# Patient Record
Sex: Male | Born: 1940 | Race: White | Hispanic: No | Marital: Married | Smoking: Former smoker
Health system: Southern US, Community
[De-identification: ages and names within clinical notes are randomized; demographics above are authoritative.]

## PROBLEM LIST (undated history)

## (undated) DIAGNOSIS — H409 Unspecified glaucoma: Secondary | ICD-10-CM

## (undated) DIAGNOSIS — I10 Essential (primary) hypertension: Secondary | ICD-10-CM

## (undated) DIAGNOSIS — J45909 Unspecified asthma, uncomplicated: Secondary | ICD-10-CM

## (undated) DIAGNOSIS — Z87442 Personal history of urinary calculi: Secondary | ICD-10-CM

## (undated) DIAGNOSIS — R0600 Dyspnea, unspecified: Secondary | ICD-10-CM

## (undated) HISTORY — PX: COLECTOMY: SHX59

## (undated) HISTORY — PX: APPENDECTOMY: SHX54

## (undated) HISTORY — PX: HERNIA REPAIR: SHX51

---

## 2001-10-22 ENCOUNTER — Ambulatory Visit (HOSPITAL_COMMUNITY): Admission: RE | Admit: 2001-10-22 | Discharge: 2001-10-22 | Payer: Self-pay | Admitting: Family Medicine

## 2001-10-22 ENCOUNTER — Encounter: Payer: Self-pay | Admitting: Family Medicine

## 2008-12-27 ENCOUNTER — Ambulatory Visit (HOSPITAL_COMMUNITY): Admission: RE | Admit: 2008-12-27 | Discharge: 2008-12-27 | Payer: Self-pay | Admitting: General Surgery

## 2010-06-20 LAB — BASIC METABOLIC PANEL
BUN: 11 mg/dL (ref 6–23)
CO2: 27 mEq/L (ref 19–32)
Calcium: 8.9 mg/dL (ref 8.4–10.5)
Chloride: 103 mEq/L (ref 96–112)
Creatinine, Ser: 0.66 mg/dL (ref 0.4–1.5)
GFR calc Af Amer: >60 mL/min (ref >60)
GFR calc non Af Amer: >60 mL/min (ref >60)
Glucose, Bld: 136 mg/dL — ABNORMAL HIGH (ref 70–99)
Potassium: 4.1 mEq/L (ref 3.5–5.1)
Sodium: 136 mEq/L (ref 135–145)

## 2010-06-20 LAB — CBC
HCT: 43.8 % (ref 39.0–52.0)
Hemoglobin: 14.9 g/dL (ref 13.0–17.0)
MCHC: 34.1 g/dL (ref 30.0–36.0)
MCV: 92.2 fL (ref 78.0–100.0)
Platelets: 235 10*3/uL (ref 150–400)
RBC: 4.75 MIL/uL (ref 4.22–5.81)
RDW: 13.5 % (ref 11.5–15.5)
WBC: 6.6 10*3/uL (ref 4.0–10.5)

## 2011-09-10 DIAGNOSIS — H4011X0 Primary open-angle glaucoma, stage unspecified: Secondary | ICD-10-CM | POA: Diagnosis not present

## 2011-09-10 DIAGNOSIS — H409 Unspecified glaucoma: Secondary | ICD-10-CM | POA: Diagnosis not present

## 2011-09-10 DIAGNOSIS — H5200 Hypermetropia, unspecified eye: Secondary | ICD-10-CM | POA: Diagnosis not present

## 2011-09-10 DIAGNOSIS — H2589 Other age-related cataract: Secondary | ICD-10-CM | POA: Diagnosis not present

## 2011-10-02 DIAGNOSIS — H4011X0 Primary open-angle glaucoma, stage unspecified: Secondary | ICD-10-CM | POA: Diagnosis not present

## 2011-10-02 DIAGNOSIS — H409 Unspecified glaucoma: Secondary | ICD-10-CM | POA: Diagnosis not present

## 2011-10-24 DIAGNOSIS — H409 Unspecified glaucoma: Secondary | ICD-10-CM | POA: Diagnosis not present

## 2011-10-24 DIAGNOSIS — H4011X0 Primary open-angle glaucoma, stage unspecified: Secondary | ICD-10-CM | POA: Diagnosis not present

## 2011-11-10 DIAGNOSIS — H25099 Other age-related incipient cataract, unspecified eye: Secondary | ICD-10-CM | POA: Diagnosis not present

## 2011-11-10 DIAGNOSIS — H4011X0 Primary open-angle glaucoma, stage unspecified: Secondary | ICD-10-CM | POA: Diagnosis not present

## 2011-11-10 DIAGNOSIS — H409 Unspecified glaucoma: Secondary | ICD-10-CM | POA: Diagnosis not present

## 2011-11-11 ENCOUNTER — Emergency Department (HOSPITAL_COMMUNITY): Payer: Medicare Other

## 2011-11-11 ENCOUNTER — Encounter (HOSPITAL_COMMUNITY): Payer: Self-pay | Admitting: Emergency Medicine

## 2011-11-11 ENCOUNTER — Other Ambulatory Visit: Payer: Self-pay

## 2011-11-11 ENCOUNTER — Emergency Department (HOSPITAL_COMMUNITY)
Admission: EM | Admit: 2011-11-11 | Discharge: 2011-11-11 | Disposition: A | Discharge status: Home / Self Care | Payer: Medicare Other | Attending: Emergency Medicine | Admitting: Emergency Medicine

## 2011-11-11 DIAGNOSIS — N200 Calculus of kidney: Secondary | ICD-10-CM | POA: Insufficient documentation

## 2011-11-11 DIAGNOSIS — R1030 Lower abdominal pain, unspecified: Secondary | ICD-10-CM

## 2011-11-11 DIAGNOSIS — R109 Unspecified abdominal pain: Secondary | ICD-10-CM | POA: Diagnosis not present

## 2011-11-11 DIAGNOSIS — M7989 Other specified soft tissue disorders: Secondary | ICD-10-CM | POA: Diagnosis not present

## 2011-11-11 DIAGNOSIS — I8310 Varicose veins of unspecified lower extremity with inflammation: Secondary | ICD-10-CM | POA: Insufficient documentation

## 2011-11-11 DIAGNOSIS — K4090 Unilateral inguinal hernia, without obstruction or gangrene, not specified as recurrent: Secondary | ICD-10-CM | POA: Diagnosis not present

## 2011-11-11 DIAGNOSIS — I872 Venous insufficiency (chronic) (peripheral): Secondary | ICD-10-CM

## 2011-11-11 HISTORY — DX: Unspecified asthma, uncomplicated: J45.909

## 2011-11-11 LAB — COMPREHENSIVE METABOLIC PANEL
ALT: 15 U/L (ref 0–53)
BUN: 11 mg/dL (ref 6–23)
CO2: 28 mEq/L (ref 19–32)
Calcium: 9.1 mg/dL (ref 8.4–10.5)
Creatinine, Ser: 0.72 mg/dL (ref 0.50–1.35)
GFR calc Af Amer: >90 mL/min (ref >90)
GFR calc non Af Amer: >90 mL/min (ref >90)
Glucose, Bld: 132 mg/dL — ABNORMAL HIGH (ref 70–99)
Sodium: 138 mEq/L (ref 135–145)

## 2011-11-11 LAB — CBC WITH DIFFERENTIAL/PLATELET
Eosinophils Absolute: 0.5 10*3/uL (ref 0.0–0.7)
Eosinophils Relative: 5 % (ref 0–5)
HCT: 43.8 % (ref 39.0–52.0)
Hemoglobin: 14.7 g/dL (ref 13.0–17.0)
Lymphocytes Relative: 15 % (ref 12–46)
Lymphs Abs: 1.4 10*3/uL (ref 0.7–4.0)
MCH: 30.9 pg (ref 26.0–34.0)
MCV: 92.2 fL (ref 78.0–100.0)
Monocytes Absolute: 0.7 10*3/uL (ref 0.1–1.0)
Monocytes Relative: 7 % (ref 3–12)
RBC: 4.75 MIL/uL (ref 4.22–5.81)
WBC: 9.7 10*3/uL (ref 4.0–10.5)

## 2011-11-11 LAB — URINALYSIS, ROUTINE W REFLEX MICROSCOPIC
Bilirubin Urine: NEGATIVE
Glucose, UA: NEGATIVE mg/dL
Hgb urine dipstick: NEGATIVE
Ketones, ur: NEGATIVE mg/dL
Leukocytes, UA: NEGATIVE
Nitrite: NEGATIVE
Protein, ur: NEGATIVE mg/dL
Specific Gravity, Urine: >1.03 — ABNORMAL HIGH (ref 1.005–1.030)
Urobilinogen, UA: 0.2 mg/dL (ref 0.0–1.0)

## 2011-11-11 LAB — PROTIME-INR: Prothrombin Time: 13.6 seconds (ref 11.6–15.2)

## 2011-11-11 LAB — TROPONIN I: Troponin I: <0.3 ng/mL (ref <0.30)

## 2011-11-11 MED ORDER — CEPHALEXIN 500 MG PO CAPS: 500.0000 mg | ORAL_CAPSULE | Freq: Three times a day (TID) | ORAL | Status: AC

## 2011-11-11 MED ORDER — MORPHINE SULFATE 4 MG/ML IJ SOLN
4.0000 mg | Freq: Once | INTRAMUSCULAR | Status: AC
  Administered 2011-11-11: 4 mg via INTRAVENOUS
  Filled 2011-11-11: qty 1

## 2011-11-11 MED ORDER — SODIUM CHLORIDE 0.9 % IV SOLN
INTRAVENOUS | Status: DC
  Administered 2011-11-11: 12:00:00 via INTRAVENOUS

## 2011-11-11 MED ORDER — TRAMADOL-ACETAMINOPHEN 37.5-325 MG PO TABS: ORAL_TABLET | ORAL | Status: AC

## 2011-11-11 MED ORDER — ONDANSETRON HCL 4 MG/2ML IJ SOLN
4.0000 mg | Freq: Once | INTRAMUSCULAR | Status: AC
  Administered 2011-11-11: 4 mg via INTRAVENOUS
  Filled 2011-11-11: qty 2

## 2011-11-11 NOTE — ED Provider Notes (Cosign Needed)
History  This chart was scribed for Ward Givens, MD by Ladona Ridgel Day. This patient was seen in room APA02/APA02 and the patient's care was started at 1012.   CSN: [id number]  Arrival date & time 11/11/11  1012   First MD Initiated Contact with Patient 11/11/11 1053      Chief Complaint  Patient presents with  . Abdominal Pain    (Consider location/radiation/quality/duration/timing/severity/associated sxs/prior treatment) The history is provided by the patient. No language interpreter was used.   Mark Harris is a 71 y.o. male who presents to the Emergency Department complaining of sudden onset of constant bilateral lower abdominal pain with diaphoresis this AM about one hour ago. He states he was at rest when his symptoms started and not sure what caused his abdominal pain, he also felt nauseated. He states he is not currently diaphoretic and his abdominal pain is pressure like and below his umbilicus on both sides of his abdomen. He states his symptoms feels somewhat similar to a previous kidney stone that he had. He denies any previous abdominal aneurysms or distention of his abdomen. He denies any back pain, flank pain, abdominal bloating,diarrhea, emesis, urinary symptoms, fever or any modifying factors to his pain. PT states his pain is a "2" and was a "7" at it's worst. He also complains of a red rash on his right lower leg which has been there for about one month with some swelling of his right lower leg.    He is followed by his PCP Dr. Phillips Odor  Past Medical History  Diagnosis Date  . Kidney stones 20 years ago   . Asthma     Past Surgical History  Procedure Date  . Hernia repair     History reviewed. No pertinent family history.  History  Substance Use Topics  . Smoking status: Former Games developer  . Smokeless tobacco: Not on file  . Alcohol Use: Yes     one glass of wine a day  Lives at home Lives with spouse   Review of Systems  Constitutional: Positive for  diaphoresis. Negative for fever and chills.  HENT: Negative for congestion.   Respiratory: Negative for cough and shortness of breath.   Gastrointestinal: Positive for abdominal pain (Lower abdominal pain. ). Negative for nausea and vomiting.  Genitourinary: Negative for dysuria and difficulty urinating.  Musculoskeletal: Negative for back pain.  Skin: Positive for rash (Rash over his right lower leg.  ).  Neurological: Negative for weakness.  All other systems reviewed and are negative.    Allergies  Peanuts  Home Medications   Current Outpatient Rx  Name Route Sig Dispense Refill  . ALBUTEROL SULFATE HFA 108 (90 BASE) MCG/ACT IN AERS Inhalation Inhale 2 puffs into the lungs every 6 (six) hours as needed. asthma    . BIMATOPROST 0.01 % OP SOLN Both Eyes Place 1 drop into both eyes at bedtime.    Marland Kitchen BRIMONIDINE TARTRATE-TIMOLOL 0.2-0.5 % OP SOLN Right Eye Place 1 drop into the right eye every 12 (twelve) hours. Prescription has not been picked up pending insurance    . FLUTICASONE-SALMETEROL 250-50 MCG/DOSE IN AEPB Inhalation Inhale 1 puff into the lungs 2 (two) times daily as needed. asthma      Triage Vitals: BP 148/54  Pulse 61  Temp 97.5 F (36.4 C) (Oral)  Resp 17  Ht 5\' 6"  (1.676 m)  Wt 178 lb (80.74 kg)  BMI 28.73 kg/m2  SpO2 95%  Vital signs normal  Physical Exam  Nursing note and vitals reviewed. Constitutional: He is oriented to person, place, and time. He appears well-developed and well-nourished.  Non-toxic appearance. He does not appear ill. No distress.  HENT:  Head: Normocephalic and atraumatic.  Right Ear: External ear normal.  Left Ear: External ear normal.  Nose: Nose normal. No mucosal edema or rhinorrhea.  Mouth/Throat: Oropharynx is clear and moist and mucous membranes are normal. No dental abscesses or uvula swelling.  Eyes: Conjunctivae and EOM are normal. Pupils are equal, round, and reactive to light.  Neck: Normal range of motion and full  passive range of motion without pain. Neck supple.  Cardiovascular: Normal rate, regular rhythm and normal heart sounds.  Exam reveals no gallop and no friction rub.   No murmur heard. Pulmonary/Chest: Effort normal and breath sounds normal. No respiratory distress. He has no wheezes. He has no rhonchi. He has no rales. He exhibits no tenderness and no crepitus.  Abdominal: Soft. Normal appearance and bowel sounds are normal. He exhibits no distension. There is no tenderness. There is no rebound and no guarding.  Musculoskeletal: Normal range of motion. He exhibits no edema and no tenderness.       Moves all extremities well.   Neurological: He is alert and oriented to person, place, and time. He has normal strength. No cranial nerve deficit.  Skin: Skin is warm, dry and intact. No rash noted. No erythema. No pallor.       Diffusely swollen right leg. Area of redness extending from right medial malleolus to mid lower leg with small appearing vesicles.   Psychiatric: He has a normal mood and affect. His speech is normal and behavior is normal. His mood appears not anxious.    ED Course  Procedures (including critical care time)    Medications  0.9 %  sodium chloride infusion (0  Intravenous Stopped 11/11/11 1454)  morphine 4 MG/ML injection 4 mg (4 mg Intravenous Given 11/11/11 1140)  ondansetron (ZOFRAN) injection 4 mg (4 mg Intravenous Given 11/11/11 1140)   Patient was pain-free at time of discharge.  After reviewing CT scan I examined his groin. He has no obvious inguinal hernia on exam. He strained and he did appear to have some fullness in the left inguinal region which was mildly tender to palpation. Pt states he has seen Dr Leticia Penna in the past.   DIAGNOSTIC STUDIES: Oxygen Saturation is 95% on room air, adequate by my interpretation.    COORDINATION OF CARE: At 1120 AM Discussed treatment plan with patient which includes abdominal CT, blood work, IV fluids, pain medicine, and  right lower leg [place]. Patient agrees.   Results for orders placed during the hospital encounter of 11/11/11  URINALYSIS, ROUTINE W REFLEX MICROSCOPIC      Component Value Range   Color, Urine YELLOW  YELLOW   APPearance CLEAR  CLEAR   Specific Gravity, Urine >1.030 (*) 1.005 - 1.030   pH 5.5  5.0 - 8.0   Glucose, UA NEGATIVE  NEGATIVE mg/dL   Hgb urine dipstick NEGATIVE  NEGATIVE   Bilirubin Urine NEGATIVE  NEGATIVE   Ketones, ur NEGATIVE  NEGATIVE mg/dL   Protein, ur NEGATIVE  NEGATIVE mg/dL   Urobilinogen, UA 0.2  0.0 - 1.0 mg/dL   Nitrite NEGATIVE  NEGATIVE   Leukocytes, UA NEGATIVE  NEGATIVE  CBC WITH DIFFERENTIAL      Component Value Range   WBC 9.7  4.0 - 10.5 K/uL   RBC 4.75  4.22 -  5.81 MIL/uL   Hemoglobin 14.7  13.0 - 17.0 g/dL   HCT 16.1  09.6 - 04.5 %   MCV 92.2  78.0 - 100.0 fL   MCH 30.9  26.0 - 34.0 pg   MCHC 33.6  30.0 - 36.0 g/dL   RDW 40.9  81.1 - 91.4 %   Platelets 236  150 - 400 K/uL   Neutrophils Relative 73  43 - 77 %   Neutro Abs 7.1  1.7 - 7.7 K/uL   Lymphocytes Relative 15  12 - 46 %   Lymphs Abs 1.4  0.7 - 4.0 K/uL   Monocytes Relative 7  3 - 12 %   Monocytes Absolute 0.7  0.1 - 1.0 K/uL   Eosinophils Relative 5  0 - 5 %   Eosinophils Absolute 0.5  0.0 - 0.7 K/uL   Basophils Relative 0  0 - 1 %   Basophils Absolute 0.0  0.0 - 0.1 K/uL  COMPREHENSIVE METABOLIC PANEL      Component Value Range   Sodium 138  135 - 145 mEq/L   Potassium 3.9  3.5 - 5.1 mEq/L   Chloride 102  96 - 112 mEq/L   CO2 28  19 - 32 mEq/L   Glucose, Bld 132 (*) 70 - 99 mg/dL   BUN 11  6 - 23 mg/dL   Creatinine, Ser 7.82  0.50 - 1.35 mg/dL   Calcium 9.1  8.4 - 95.6 mg/dL   Total Protein 7.3  6.0 - 8.3 g/dL   Albumin 3.6  3.5 - 5.2 g/dL   AST 17  0 - 37 U/L   ALT 15  0 - 53 U/L   Alkaline Phosphatase 80  39 - 117 U/L   Total Bilirubin 0.3  0.3 - 1.2 mg/dL   GFR calc non Af Amer >90  >90 mL/min   GFR calc Af Amer >90  >90 mL/min  TROPONIN I      Component Value Range    Troponin I <0.30  <0.30 ng/mL  APTT      Component Value Range   aPTT 30  24 - 37 seconds  PROTIME-INR      Component Value Range   Prothrombin Time 13.6  11.6 - 15.2 seconds   INR 1.02  0.00 - 1.49   Laboratory interpretation all normal except concentrated urine, mild hyperglycemia  Ct Abdomen Pelvis Wo Contrast  11/11/2011  *RADIOLOGY REPORT*  Clinical Data: Abdominal pain.  CT ABDOMEN AND PELVIS WITHOUT CONTRAST  Technique:  Multidetector CT imaging of the abdomen and pelvis was performed following the standard protocol without intravenous contrast.  Comparison: No priors.  Findings:  Lung Bases: Linear opacities in the medial segment of the right middle lobe and inferior segment of the lingula, likely represent areas of scarring (both areas appear to be associated with focal expansion of the extrapleural fat).  4 mm left lower lobe pulmonary nodule (image 6 of series three).  Small - moderate hiatal hernia.  Abdomen/Pelvis:  Image 23 of series 2 demonstrates a 4 mm nonobstructive calculus in the upper pole collecting system of the right kidney.  No additional calculi are noted along the course of either ureter, with the left renal collecting system or within the lumen of the urinary bladder.  No hydroureteronephrosis to suggest urinary tract obstruction at this time.  The unenhanced appearance of the liver, gallbladder, pancreas, spleen and bilateral adrenal glands is unremarkable.  No ascites or pneumoperitoneum and no pathologic distension of  bowel.  Normal appendix.  Numerous colonic diverticula, without surrounding inflammatory changes to suggest acute diverticulitis at this time. Prostate and urinary bladder are unremarkable.  There are large bilateral inguinal hernias, containing only fat (left greater than right).  On the left side, one of the colonic diverticula appears to extend toward the neck of the left inguinal hernia.  Musculoskeletal: There are no aggressive appearing lytic or blastic  lesions noted in the visualized portions of the skeleton.  IMPRESSION: 1.  No definite acute findings in the abdomen or pelvis to account for the patient's symptoms. 2.  However, there are large bilateral inguinal hernias (left greater than right) which appear to contain only fat. Notably, there is extension of a colonic diverticula from the proximal sigmoid colon toward the neck of the hernia on the left side, suggesting that there may be intermittent extension of bowel into this hernia.  No current signs of bowel incarceration or obstruction at this time. 3.  Normal appendix. 4.  4 mm nonobstructive calculus in the upper pole of the right kidney. 5.  Colonic diverticulosis without evidence to suggest acute diverticulitis at this time.   Original Report Authenticated By: Florencia Reasons, M.D.    US Venous Img Lower Unilateral Right  11/11/2011  *RADIOLOGY REPORT*  Clinical Data: Right lower extremity swelling, lower leg redness  RIGHT LOWER EXTREMITY VENOUS DUPLEX ULTRASOUND  Technique:  Gray-scale sonography with graded compression, as well as color Doppler and duplex ultrasound, were performed to evaluate the deep venous system of the lower extremity from the level of the common femoral vein through the popliteal and proximal calf veins. Spectral Doppler was utilized to evaluate flow at rest and with distal augmentation maneuvers.  Comparison:  None  Findings: Deep venous system patent and compressible from right groin through popliteal fossa. Spontaneous venous flow present with intact augmentation and evidence of respiratory phasicity. No intraluminal thrombus identified. Visualized portion of the greater saphenous system unremarkable.  IMPRESSION: No evidence of deep venous thrombosis in the right lower extremity.   Original Report Authenticated By: Lollie Marrow, M.D.     Date: 11/11/2011  Rate: 62  Rhythm: normal sinus rhythm  QRS Axis: normal  Intervals: normal  ST/T Wave abnormalities:  normal  Conduction Disutrbances:none  Narrative Interpretation:   Old EKG Reviewed: [facility] 12/25/2008    1. Lower abdominal pain   2. Inguinal hernia   3. Renal stone   4. Venous stasis dermatitis     New Prescriptions   CEPHALEXIN (KEFLEX) 500 MG CAPSULE    Take 1 capsule (500 mg total) by mouth 3 (three) times daily.   TRAMADOL-ACETAMINOPHEN (ULTRACET) 37.5-325 MG PER TABLET    2 tabs po QID prn pain    Plan discharge  Devoria Albe, MD, FACEP   MDM  I personally performed the services described in this documentation, which was scribed in my presence. The recorded information has been reviewed and considered.  Devoria Albe, MD, Armando Gang          Ward Givens, MD 11/11/11 [id number]

## 2011-11-11 NOTE — ED Notes (Signed)
Pt c/o sudden continuous cramping to lower abd around 35 min ago with sweating and wife states he was very pale. Pt felt "faint"Pt is alert/oriented. Pain present but not as bad now. Denies gu sx's. Last normal bm x 2 hrs. Has not noticed any black or bloody stools. Was nauseated earlier. Denies cp/sob. nondiaphoretic now and color wnl.

## 2011-11-11 NOTE — ED Notes (Signed)
Pt is aware of a urine sample, pt states he cannot void at this time, pt stated when he voids he will let staff know.

## 2011-11-18 DIAGNOSIS — K4090 Unilateral inguinal hernia, without obstruction or gangrene, not specified as recurrent: Secondary | ICD-10-CM | POA: Diagnosis not present

## 2011-11-18 DIAGNOSIS — R7301 Impaired fasting glucose: Secondary | ICD-10-CM | POA: Diagnosis not present

## 2011-11-18 DIAGNOSIS — J45909 Unspecified asthma, uncomplicated: Secondary | ICD-10-CM | POA: Diagnosis not present

## 2011-12-09 DIAGNOSIS — H40029 Open angle with borderline findings, high risk, unspecified eye: Secondary | ICD-10-CM | POA: Diagnosis not present

## 2011-12-09 DIAGNOSIS — H409 Unspecified glaucoma: Secondary | ICD-10-CM | POA: Diagnosis not present

## 2011-12-09 DIAGNOSIS — H4011X0 Primary open-angle glaucoma, stage unspecified: Secondary | ICD-10-CM | POA: Diagnosis not present

## 2011-12-20 DIAGNOSIS — Z23 Encounter for immunization: Secondary | ICD-10-CM | POA: Diagnosis not present

## 2012-03-12 DIAGNOSIS — H40029 Open angle with borderline findings, high risk, unspecified eye: Secondary | ICD-10-CM | POA: Diagnosis not present

## 2012-03-12 DIAGNOSIS — H4011X0 Primary open-angle glaucoma, stage unspecified: Secondary | ICD-10-CM | POA: Diagnosis not present

## 2012-03-12 DIAGNOSIS — H409 Unspecified glaucoma: Secondary | ICD-10-CM | POA: Diagnosis not present

## 2012-06-15 ENCOUNTER — Encounter (HOSPITAL_COMMUNITY): Payer: Self-pay | Admitting: Pharmacy Technician

## 2012-06-17 ENCOUNTER — Other Ambulatory Visit (HOSPITAL_COMMUNITY): Payer: Self-pay | Admitting: General Surgery

## 2012-06-17 NOTE — Patient Instructions (Addendum)
Mark Harris  06/17/2012   Your procedure is scheduled on:  06/25/12  Report to [facility] at 0730 AM.  Call this number if you have problems the morning of surgery: [phone]   Remember:   Do not eat food or drink liquids after midnight.   Take these medicines the morning of surgery with A SIP OF WATER: advair, albuterol   Do not wear jewelry, make-up or nail polish.  Do not wear lotions, powders, or perfumes. You may wear deodorant.  Do not shave 48 hours prior to surgery. Men may shave face and neck.  Do not bring valuables to the hospital.  Contacts, dentures or bridgework may not be worn into surgery.  Leave suitcase in the car. After surgery it may be brought to your room.  For patients admitted to the hospital, checkout time is 11:00 AM the day of  discharge.   Patients discharged the day of surgery will not be allowed to drive  home.  Name and phone number of your driver: family  Special Instructions: Shower using CHG 2 nights before surgery and the night before surgery.  If you shower the day of surgery use CHG.  Use special wash - you have one bottle of CHG for all showers.  You should use approximately 1/3 of the bottle for each shower.   Please read over the following fact sheets that you were given: Pain Booklet, MRSA Information, Surgical Site Infection Prevention, Anesthesia Post-op Instructions and Care and Recovery After Surgery    PATIENT INSTRUCTIONS POST-ANESTHESIA  IMMEDIATELY FOLLOWING SURGERY:  Do not drive or operate machinery for the first twenty four hours after surgery.  Do not make any important decisions for twenty four hours after surgery or while taking narcotic pain medications or sedatives.  If you develop intractable nausea and vomiting or a severe headache please notify your doctor immediately.  FOLLOW-UP:  Please make an appointment with your surgeon as instructed. You do not need to follow up with anesthesia unless specifically instructed to do  so.  WOUND CARE INSTRUCTIONS (if applicable):  Keep a dry clean dressing on the anesthesia/puncture wound site if there is drainage.  Once the wound has quit draining you may leave it open to air.  Generally you should leave the bandage intact for twenty four hours unless there is drainage.  If the epidural site drains for more than 36-48 hours please call the anesthesia department.  QUESTIONS?:  Please feel free to call your physician or the hospital operator if you have any questions, and they will be happy to assist you.      Inguinal Hernia, Adult Muscles help keep everything in the body in its proper place. But if a weak spot in the muscles develops, something can poke through. That is called a hernia. When this happens in the lower part of the belly (abdomen), it is called an inguinal hernia. (It takes its name from a part of the body in this region called the inguinal canal.) A weak spot in the wall of muscles lets some fat or part of the small intestine bulge through. An inguinal hernia can develop at any age. Men get them more often than women. CAUSES  In adults, an inguinal hernia develops over time.  It can be triggered by:  Suddenly straining the muscles of the lower abdomen.  Lifting heavy objects.  Straining to have a bowel movement. Difficult bowel movements (constipation) can lead to this.  Constant coughing. This may be caused  by smoking or lung disease.  Being overweight.  Being pregnant.  Working at a job that requires long periods of standing or heavy lifting.  Having had an inguinal hernia before. One type can be an emergency situation. It is called a strangulated inguinal hernia. It develops if part of the small intestine slips through the weak spot and cannot get back into the abdomen. The blood supply can be cut off. If that happens, part of the intestine may die. This situation requires emergency surgery. SYMPTOMS  Often, a small inguinal hernia has no  symptoms. It is found when a healthcare provider does a physical exam. Larger hernias usually have symptoms.   In adults, symptoms may include:  A lump in the groin. This is easier to see when the person is standing. It might disappear when lying down.  In men, a lump in the scrotum.  Pain or burning in the groin. This occurs especially when lifting, straining or coughing.  A dull ache or feeling of pressure in the groin.  Signs of a strangulated hernia can include:  A bulge in the groin that becomes very painful and tender to the touch.  A bulge that turns red or purple.  Fever, nausea and vomiting.  Inability to have a bowel movement or to pass gas. DIAGNOSIS  To decide if you have an inguinal hernia, a healthcare provider will probably do a physical examination.  This will include asking questions about any symptoms you have noticed.  The healthcare provider might feel the groin area and ask you to cough. If an inguinal hernia is felt, the healthcare provider may try to slide it back into the abdomen.  Usually no other tests are needed. TREATMENT  Treatments can vary. The size of the hernia makes a difference. Options include:  Watchful waiting. This is often suggested if the hernia is small and you have had no symptoms.  No medical procedure will be done unless symptoms develop.  You will need to watch closely for symptoms. If any occur, contact your healthcare provider right away.  Surgery. This is used if the hernia is larger or you have symptoms.  Open surgery. This is usually an outpatient procedure (you will not stay overnight in a hospital). An cut (incision) is made through the skin in the groin. The hernia is put back inside the abdomen. The weak area in the muscles is then repaired by herniorrhaphy or hernioplasty. Herniorrhaphy: in this type of surgery, the weak muscles are sewn back together. Hernioplasty: a patch or mesh is used to close the weak area in the  abdominal wall.  Laparoscopy. In this procedure, a surgeon makes small incisions. A thin tube with a tiny video camera (called a laparoscope) is put into the abdomen. The surgeon repairs the hernia with mesh by looking with the video camera and using two long instruments. HOME CARE INSTRUCTIONS   After surgery to repair an inguinal hernia:  You will need to take pain medicine prescribed by your healthcare provider. Follow all directions carefully.  You will need to take care of the wound from the incision.  Your activity will be restricted for awhile. This will probably include no heavy lifting for several weeks. You also should not do anything too active for a few weeks. When you can return to work will depend on the type of job that you have.  During "watchful waiting" periods, you should:  Maintain a healthy weight.  Eat a diet high in fiber (fruits,  vegetables and whole grains).  Drink plenty of fluids to avoid constipation. This means drinking enough water and other liquids to keep your urine clear or pale yellow.  Do not lift heavy objects.  Do not stand for long periods of time.  Quit smoking. This should keep you from developing a frequent cough. SEEK MEDICAL CARE IF:   A bulge develops in your groin area.  You feel pain, a burning sensation or pressure in the groin. This might be worse if you are lifting or straining.  You develop a fever of more than 100.5 F (38.1 C). SEEK IMMEDIATE MEDICAL CARE IF:   Pain in the groin increases suddenly.  A bulge in the groin gets bigger suddenly and does not go down.  For men, there is sudden pain in the scrotum. Or, the size of the scrotum increases.  A bulge in the groin area becomes red or purple and is painful to touch.  You have nausea or vomiting that does not go away.  You feel your heart beating much faster than normal.  You cannot have a bowel movement or pass gas.  You develop a fever of more than 102.0 F  (38.9 C). Document Released: 07/20/2008 Document Revised: 05/26/2011 Document Reviewed: 07/20/2008 [facility] Patient Information 2013 [place], [place].

## 2012-06-18 ENCOUNTER — Encounter (HOSPITAL_COMMUNITY): Payer: Self-pay

## 2012-06-18 ENCOUNTER — Encounter (HOSPITAL_COMMUNITY)
Admission: RE | Admit: 2012-06-18 | Discharge: 2012-06-18 | Disposition: A | Discharge status: Home / Self Care | Payer: Medicare Other | Source: Ambulatory Visit | Attending: General Surgery | Admitting: General Surgery

## 2012-06-18 DIAGNOSIS — K4090 Unilateral inguinal hernia, without obstruction or gangrene, not specified as recurrent: Secondary | ICD-10-CM | POA: Diagnosis not present

## 2012-06-18 DIAGNOSIS — Z01812 Encounter for preprocedural laboratory examination: Secondary | ICD-10-CM | POA: Diagnosis not present

## 2012-06-18 HISTORY — DX: Unspecified glaucoma: H40.9

## 2012-06-18 LAB — CBC WITH DIFFERENTIAL/PLATELET
Eosinophils Absolute: 0.3 10*3/uL (ref 0.0–0.7)
Eosinophils Relative: 5 % (ref 0–5)
Hemoglobin: 14.3 g/dL (ref 13.0–17.0)
Lymphs Abs: 2.3 10*3/uL (ref 0.7–4.0)
MCH: 30.7 pg (ref 26.0–34.0)
MCV: 92.5 fL (ref 78.0–100.0)
Monocytes Relative: 9 % (ref 3–12)
RBC: 4.66 MIL/uL (ref 4.22–5.81)

## 2012-06-18 LAB — BASIC METABOLIC PANEL
BUN: 11 mg/dL (ref 6–23)
CO2: 27 mEq/L (ref 19–32)
Calcium: 9.1 mg/dL (ref 8.4–10.5)
Glucose, Bld: 141 mg/dL — ABNORMAL HIGH (ref 70–99)
Potassium: 4 mEq/L (ref 3.5–5.1)
Sodium: 139 mEq/L (ref 135–145)

## 2012-06-25 ENCOUNTER — Ambulatory Visit (HOSPITAL_COMMUNITY)
Admission: RE | Admit: 2012-06-25 | Discharge: 2012-06-25 | Disposition: A | Discharge status: Home / Self Care | Payer: Medicare Other | Source: Ambulatory Visit | Attending: General Surgery | Admitting: General Surgery

## 2012-06-25 ENCOUNTER — Encounter (HOSPITAL_COMMUNITY): Payer: Self-pay | Admitting: Anesthesiology

## 2012-06-25 ENCOUNTER — Encounter (HOSPITAL_COMMUNITY): Payer: Self-pay | Admitting: *Deleted

## 2012-06-25 ENCOUNTER — Encounter (HOSPITAL_COMMUNITY)
Admission: RE | Disposition: A | Discharge status: Home / Self Care | Payer: Self-pay | Source: Ambulatory Visit | Attending: General Surgery

## 2012-06-25 ENCOUNTER — Ambulatory Visit (HOSPITAL_COMMUNITY): Payer: Medicare Other | Admitting: Anesthesiology

## 2012-06-25 DIAGNOSIS — Z01812 Encounter for preprocedural laboratory examination: Secondary | ICD-10-CM | POA: Diagnosis not present

## 2012-06-25 DIAGNOSIS — K4090 Unilateral inguinal hernia, without obstruction or gangrene, not specified as recurrent: Secondary | ICD-10-CM | POA: Diagnosis not present

## 2012-06-25 HISTORY — PX: INGUINAL HERNIA REPAIR: SHX194

## 2012-06-25 HISTORY — PX: INSERTION OF MESH: SHX5868

## 2012-06-25 SURGERY — REPAIR, HERNIA, INGUINAL, ADULT
Anesthesia: General | Site: Groin | Laterality: Right | Wound class: Clean

## 2012-06-25 MED ORDER — FENTANYL CITRATE 0.05 MG/ML IJ SOLN
INTRAMUSCULAR | Status: AC
  Filled 2012-06-25: qty 2

## 2012-06-25 MED ORDER — CEFAZOLIN SODIUM-DEXTROSE 2-3 GM-% IV SOLR
2.0000 g | INTRAVENOUS | Status: AC
  Administered 2012-06-25: 2 g via INTRAVENOUS

## 2012-06-25 MED ORDER — MIDAZOLAM HCL 2 MG/2ML IJ SOLN
INTRAMUSCULAR | Status: AC
  Filled 2012-06-25: qty 2

## 2012-06-25 MED ORDER — ONDANSETRON HCL 4 MG/2ML IJ SOLN
INTRAMUSCULAR | Status: AC
  Filled 2012-06-25: qty 2

## 2012-06-25 MED ORDER — MIDAZOLAM HCL 5 MG/5ML IJ SOLN
INTRAMUSCULAR | Status: DC | PRN
  Administered 2012-06-25: 2 mg via INTRAVENOUS

## 2012-06-25 MED ORDER — ONDANSETRON HCL 4 MG/2ML IJ SOLN
4.0000 mg | Freq: Once | INTRAMUSCULAR | Status: AC | PRN
  Administered 2012-06-25: 4 mg via INTRAVENOUS

## 2012-06-25 MED ORDER — HYDROCODONE-ACETAMINOPHEN 5-325 MG PO TABS: 1.0000 | ORAL_TABLET | ORAL | Status: DC | PRN

## 2012-06-25 MED ORDER — SODIUM CHLORIDE 0.9 % IR SOLN
Status: DC | PRN
  Administered 2012-06-25: 1000 mL

## 2012-06-25 MED ORDER — CEFAZOLIN SODIUM-DEXTROSE 2-3 GM-% IV SOLR
INTRAVENOUS | Status: AC
  Filled 2012-06-25: qty 50

## 2012-06-25 MED ORDER — CELECOXIB 100 MG PO CAPS
400.0000 mg | ORAL_CAPSULE | Freq: Every day | ORAL | Status: AC
  Administered 2012-06-25: 400 mg via ORAL

## 2012-06-25 MED ORDER — LIDOCAINE HCL (PF) 1 % IJ SOLN
INTRAMUSCULAR | Status: AC
  Filled 2012-06-25: qty 5

## 2012-06-25 MED ORDER — PROPOFOL 10 MG/ML IV EMUL
INTRAVENOUS | Status: AC
  Filled 2012-06-25: qty 20

## 2012-06-25 MED ORDER — MIDAZOLAM HCL 2 MG/2ML IJ SOLN
1.0000 mg | INTRAMUSCULAR | Status: DC | PRN
  Administered 2012-06-25 (×2): 2 mg via INTRAVENOUS

## 2012-06-25 MED ORDER — PROPOFOL 10 MG/ML IV BOLUS
INTRAVENOUS | Status: DC | PRN
  Administered 2012-06-25: 120 mg via INTRAVENOUS

## 2012-06-25 MED ORDER — BUPIVACAINE HCL (PF) 0.5 % IJ SOLN
INTRAMUSCULAR | Status: AC
  Filled 2012-06-25: qty 30

## 2012-06-25 MED ORDER — LIDOCAINE HCL 1 % IJ SOLN
INTRAMUSCULAR | Status: DC | PRN
  Administered 2012-06-25: 50 mg via INTRADERMAL

## 2012-06-25 MED ORDER — CHLORHEXIDINE GLUCONATE 4 % EX LIQD: 1.0000 "application " | Freq: Once | CUTANEOUS | Status: DC

## 2012-06-25 MED ORDER — FENTANYL CITRATE 0.05 MG/ML IJ SOLN
25.0000 ug | INTRAMUSCULAR | Status: DC | PRN
  Administered 2012-06-25 (×2): 50 ug via INTRAVENOUS

## 2012-06-25 MED ORDER — FENTANYL CITRATE 0.05 MG/ML IJ SOLN
INTRAMUSCULAR | Status: DC | PRN
  Administered 2012-06-25: 50 ug via INTRAVENOUS
  Administered 2012-06-25 (×2): 25 ug via INTRAVENOUS

## 2012-06-25 MED ORDER — CELECOXIB 100 MG PO CAPS
ORAL_CAPSULE | ORAL | Status: AC
  Filled 2012-06-25: qty 4

## 2012-06-25 MED ORDER — BUPIVACAINE HCL (PF) 0.5 % IJ SOLN
INTRAMUSCULAR | Status: DC | PRN
  Administered 2012-06-25: 10 mL

## 2012-06-25 MED ORDER — LACTATED RINGERS IV SOLN
INTRAVENOUS | Status: DC
  Administered 2012-06-25: 08:00:00 via INTRAVENOUS

## 2012-06-25 SURGICAL SUPPLY — 40 items
BAG HAMPER (MISCELLANEOUS) ×2 IMPLANT
BENZOIN TINCTURE PRP APPL 2/3 (GAUZE/BANDAGES/DRESSINGS) ×2 IMPLANT
CLOTH BEACON ORANGE TIMEOUT ST (SAFETY) ×2 IMPLANT
COVER LIGHT HANDLE STERIS (MISCELLANEOUS) ×4 IMPLANT
DECANTER SPIKE VIAL GLASS SM (MISCELLANEOUS) ×2 IMPLANT
DRAIN PENROSE 18X.75 LTX STRL (MISCELLANEOUS) ×2 IMPLANT
DURAPREP 26ML APPLICATOR (WOUND CARE) ×2 IMPLANT
ELECT REM PT RETURN 9FT ADLT (ELECTROSURGICAL) ×2
ELECTRODE REM PT RTRN 9FT ADLT (ELECTROSURGICAL) ×1 IMPLANT
FORMALIN 10 PREFIL 120ML (MISCELLANEOUS) ×2 IMPLANT
GLOVE BIOGEL PI IND STRL 7.0 (GLOVE) ×2 IMPLANT
GLOVE BIOGEL PI IND STRL 7.5 (GLOVE) ×2 IMPLANT
GLOVE BIOGEL PI INDICATOR 7.0 (GLOVE) ×2
GLOVE BIOGEL PI INDICATOR 7.5 (GLOVE) ×2
GLOVE ECLIPSE 7.0 STRL STRAW (GLOVE) ×2 IMPLANT
GLOVE SS BIOGEL STRL SZ 6.5 (GLOVE) ×2 IMPLANT
GLOVE SUPERSENSE BIOGEL SZ 6.5 (GLOVE) ×2
GOWN STRL REIN XL XLG (GOWN DISPOSABLE) ×6 IMPLANT
INST SET MINOR GENERAL (KITS) ×2 IMPLANT
KIT ROOM TURNOVER APOR (KITS) ×2 IMPLANT
MANIFOLD NEPTUNE II (INSTRUMENTS) ×2 IMPLANT
MESH HERNIA 1.6X1.9 PLUG LRG (Mesh General) ×1 IMPLANT
MESH HERNIA PLUG LRG (Mesh General) ×1 IMPLANT
NEEDLE HYPO 25X1 1.5 SAFETY (NEEDLE) ×2 IMPLANT
NS IRRIG 1000ML POUR BTL (IV SOLUTION) ×2 IMPLANT
PACK MINOR (CUSTOM PROCEDURE TRAY) ×2 IMPLANT
PAD ARMBOARD 7.5X6 YLW CONV (MISCELLANEOUS) ×2 IMPLANT
SET BASIN LINEN APH (SET/KITS/TRAYS/PACK) ×2 IMPLANT
STRIP CLOSURE SKIN 1/2X4 (GAUZE/BANDAGES/DRESSINGS) ×2 IMPLANT
SUT ETHIBOND NAB MO 7 #0 18IN (SUTURE) ×2 IMPLANT
SUT MNCRL AB 4-0 PS2 18 (SUTURE) ×2 IMPLANT
SUT SILK 2 0 (SUTURE)
SUT SILK 2-0 18XBRD TIE 12 (SUTURE) IMPLANT
SUT VIC AB 2-0 CT1 27 (SUTURE) ×1
SUT VIC AB 2-0 CT1 TAPERPNT 27 (SUTURE) ×1 IMPLANT
SUT VIC AB 3-0 SH 27 (SUTURE) ×1
SUT VIC AB 3-0 SH 27X BRD (SUTURE) ×1 IMPLANT
SUT VICRYL AB 3 0 TIES (SUTURE) IMPLANT
SYR BULB IRRIGATION 50ML (SYRINGE) ×2 IMPLANT
SYR CONTROL 10ML LL (SYRINGE) ×2 IMPLANT

## 2012-06-25 NOTE — Op Note (Signed)
Patient:  Mark Harris  DOB:  1941/02/22  MRN:  [id number]   Preop Diagnosis:  Right inguinal hernia  Postop Diagnosis:  The same (direct)  Procedure:  Right inguinal herniorrhaphy with mesh  Surgeon:  Dr. Tilford Pillar  Anes:  General endotracheal, 0.5% Sensorcaine plain for local  Indications:  Patient is a 72 year old male presented to my office with a history of a bulge in his right groin. Evaluation was consistent for a right inguinal hernia which is reducible. Risks benefits alternatives of repair were discussed at length the patient including but not limited to risk of bleeding, infection, ischemic orchiditis, testicular loss, paresthesias, chronic pain, intraoperative cardiac and pulmonary events. His questions and concerns are addressed the patient as consented for the planned procedure.  Procedure note:  Patient is taken to the operating room was placed in supine position the or table time the general anesthetic is administered. Once patient was asleep symmetrically intubated by the nurse anesthetist. At this point his abdomen and groin was prepped with DuraPrep solution and draped in standard fashion. Time out was performed. A marking pen is utilized to [place] the planned incision which was created with a 10 blade scalpel. Additional dissection down to subcutaneous tissues including Scarpa's fascia is carried out using electrocautery. Dissection was carried out down to the external oblique fascia which is scored with a 15 blade scalpel and opened medially to the external inguinal ring with Metzenbaum scissors. At this point the cord structures and hernia are dissected free from the canal walls. This is done with blunt digital dissection. A window was created behind the cord structures over the pubic tubercle with blunt digital dissection. A Penrose drain is placed behind the cord structures and help elevate and retract the cord structures from the surgical field. Upon inspection the  hernia defect is noted to be correct. I did inspect the cord structures and did not identify any indirect component. The direct component is freed from the fascial defect and reduced back into the peritoneal cavity. At this point a large mesh plug was brought inserted into the defect. It was secured circumferentially to the fascial defect with 0 Ethibond suture in simple or fashion. At this time the mesh onlay was then brought to the field and was secured with 0 Ethibond sutures. The onlay was pexed medially over the pubic tubercle, superiorly to the conjoined tendon, inferiorly to the shelving portion of the inguinal ligament, and laterally the keyhole defect was secured around the cord structures and tucked under the external oblique fascia. At this time is quite pleased with the appearance of the repair. The Penrose drain was removed and the field was irrigated. The cord structures were returned back into the canal. The external oblique fascia was reapproximated using a 2-0 Vicryl suture in a running continuous fashion. The local anesthetic is instilled. A 3-0 Vicryl was utilized to reapproximate Scarpa's fascia in a running continuous fashion. The skin edges were then reapproximated using a 4-0 Monocryl in a running subcuticular suture. The skin was washed dried moist dry towel. Benzoin is applied around incision. Half-inch are suture placed. The drapes removed patient left come out of general anesthetic and was transferred to the PACU in stable condition. At the conclusion of procedure all instrument, sponge, needle counts are correct. Patient tolerated procedure extremely well.  Complications:  None apparent  EBL:  Minimal  Specimen:  None

## 2012-06-25 NOTE — H&P (Signed)
  NTS SOAP Note  Vital Signs:  Vitals as of: 11/18/2011: Systolic 167: Diastolic 76: Heart Rate 63: Temp 97.62F: Height 86ft 6in: Weight 182Lbs 0 Ounces: BMI 29  BMI : 29.38 kg/m2  Subjective: This 72 Years 86 Months old Male presents for of had pain in right groin.  Went to the ED last Tuesday.  Was told he had a hernia.  No pain or issue since.  No fever or chills.  No prior inguinal hernia.  No trauma.  No popping or tearing sensation. No signs or symptoms of incarceration/strangulation.  Review of Symptoms:  Constitutional:unremarkable   Head:unremarkable    :  blurred Nose/Mouth/Throat:unremarkable Cardiovascular:  unremarkable     wheezing, occ sob Gastrointestinal:  unremarkable  as per HPI Genitourinary:unremarkable     Musculoskeletal:unremarkable   occ rash Breast:unremarkable   Hematolgic/Lymphatic:unremarkable     Allergic/Immunologic:unremarkable     Past Medical History:    Reviewed   Past Medical History  Surgical History: umbilical hernia (Zoya Sprecher), renal calculi Medical Problems: renal calculi, asthma, hypercholesterolemia. Psychiatric History: none Allergies: nkda Medications: albuterol, lumigan, advair   Social History:Reviewed  Social History  Preferred Language: English ([place]) Race:  White Ethnicity: Not Hispanic / Latino Age: 72 Years 10 Months Marital Status:  M Alcohol: occassional Recreational drug(s): none   Smoking Status: Never smoker reviewed on 11/18/2011  Family History:  Reviewed   Family History  Is there a family history [id number]    Objective Information: General:  Well appearing, well nourished in no distress. Skin:     no rash or prominent lesions Head:Atraumatic; no masses; no abnormalities Eyes:  conjunctiva clear, EOM intact, PERRL Throat:  no erythema, exudates or lesions. Neck:  Supple without lymphadenopathy.  Heart:  RRR,  no murmur Lungs:    CTA bilaterally, no wheezes, rhonchi, rales.  Breathing unlabored. Abdomen:Soft, NT/ND, no HSM, no masses.  Laxity of L inguinal region.  Small Right Inguinal hernia.  Reducible. Extremities:  No deformities, clubbing, cyanosis, or edema.   Assessment:    Plan: RIH.  Options discussed with patient.  Patient will monitor for now.  SSx of incarceration and strangulation reviewed.  Patient will call when ready to proceed.  Patient Education:Alternative treatments to surgery were discussed with patient (and family).  Risks and benefits  of procedure were fully explained to the patient (and family) who gave informed consent. Patient/family questions were addressed.  Follow-up:PRN/pending surgery

## 2012-06-25 NOTE — Interval H&P Note (Signed)
History and Physical Interval Note:  06/25/2012 7:40 AM  Mark Harris  has presented today for surgery, with the diagnosis of right inguinal hernia   The various methods of treatment have been discussed with the patient and family. After consideration of risks, benefits and other options for treatment, the patient has consented to  Procedure(s): HERNIA REPAIR INGUINAL ADULT (Right) as a surgical intervention .  The patient's history has been reviewed, patient examined, no change in status, stable for surgery.  I have reviewed the patient's chart and labs.  Questions were answered to the patient's satisfaction.     Galen Russman C

## 2012-06-25 NOTE — Anesthesia Postprocedure Evaluation (Signed)
  Anesthesia Post-op Note  Patient: Mark Harris  Procedure(s) Performed: Procedure(s): HERNIA REPAIR INGUINAL ADULT (Right)  Patient Location: PACU  Anesthesia Type:General  Level of Consciousness: awake, alert , oriented and patient cooperative  Airway and Oxygen Therapy: Patient Spontanous Breathing  Post-op Pain: 3 /10, mild  Post-op Assessment: Post-op Vital signs reviewed, Patient's Cardiovascular Status Stable, Respiratory Function Stable, Patent Airway, No signs of Nausea or vomiting and Pain level controlled  Post-op Vital Signs: Reviewed and stable  Complications: No apparent anesthesia complications

## 2012-06-25 NOTE — Progress Notes (Signed)
Awake. Sprite given to drink. Refuses graham crackers.

## 2012-06-25 NOTE — Transfer of Care (Signed)
Immediate Anesthesia Transfer of Care Note  Patient: Mark Harris  Procedure(s) Performed: Procedure(s): HERNIA REPAIR INGUINAL ADULT (Right)  Patient Location: PACU  Anesthesia Type:General  Level of Consciousness: awake and patient cooperative  Airway & Oxygen Therapy: Patient Spontanous Breathing and Patient connected to face mask oxygen  Post-op Assessment: Report given to PACU RN, Post -op Vital signs reviewed and stable and Patient moving all extremities  Post vital signs: Reviewed and stable  Complications: No apparent anesthesia complications

## 2012-06-25 NOTE — Anesthesia Procedure Notes (Signed)
Procedure Name: LMA Insertion Date/Time: 06/25/2012 9:29 AM Performed by: Despina Hidden Pre-anesthesia Checklist: Emergency Drugs available, Suction available, Patient being monitored and Patient identified Patient Re-evaluated:Patient Re-evaluated prior to inductionOxygen Delivery Method: Circle system utilized Preoxygenation: Pre-oxygenation with 100% oxygen Intubation Type: IV induction Ventilation: Mask ventilation without difficulty LMA Size: 4.0 Grade View: Grade I Number of attempts: 1 Placement Confirmation: positive ETCO2 and breath sounds checked- equal and bilateral Tube secured with: Tape Dental Injury: Teeth and Oropharynx as per pre-operative assessment

## 2012-06-25 NOTE — Anesthesia Preprocedure Evaluation (Signed)
Anesthesia Evaluation  Patient identified by MRN, date of birth, ID band Patient awake    Reviewed: Allergy & Precautions, H&P , NPO status , Patient's Chart, lab work & pertinent test results  Airway Mallampati: I TM Distance: >3 FB     Dental  (+) Partial Upper and Partial Lower   Pulmonary asthma ,  breath sounds clear to auscultation        Cardiovascular negative cardio ROS  Rhythm:Regular Rate:Normal     Neuro/Psych    GI/Hepatic negative GI ROS,   Endo/Other    Renal/GU      Musculoskeletal   Abdominal   Peds  Hematology   Anesthesia Other Findings   Reproductive/Obstetrics                           Anesthesia Physical Anesthesia Plan  ASA: II  Anesthesia Plan: General   Post-op Pain Management:    Induction: Intravenous  Airway Management Planned: LMA  Additional Equipment:   Intra-op Plan:   Post-operative Plan: Extubation in OR  Informed Consent: I have reviewed the patients History and Physical, chart, labs and discussed the procedure including the risks, benefits and alternatives for the proposed anesthesia with the patient or authorized representative who has indicated his/her understanding and acceptance.     Plan Discussed with:   Anesthesia Plan Comments:         Anesthesia Quick Evaluation

## 2012-06-28 ENCOUNTER — Encounter (HOSPITAL_COMMUNITY): Payer: Self-pay | Admitting: General Surgery

## 2012-07-02 DIAGNOSIS — H4011X0 Primary open-angle glaucoma, stage unspecified: Secondary | ICD-10-CM | POA: Diagnosis not present

## 2012-07-02 DIAGNOSIS — H40029 Open angle with borderline findings, high risk, unspecified eye: Secondary | ICD-10-CM | POA: Diagnosis not present

## 2012-07-02 DIAGNOSIS — H409 Unspecified glaucoma: Secondary | ICD-10-CM | POA: Diagnosis not present

## 2012-10-05 DIAGNOSIS — H2589 Other age-related cataract: Secondary | ICD-10-CM | POA: Diagnosis not present

## 2012-10-05 DIAGNOSIS — H52229 Regular astigmatism, unspecified eye: Secondary | ICD-10-CM | POA: Diagnosis not present

## 2012-10-05 DIAGNOSIS — H5200 Hypermetropia, unspecified eye: Secondary | ICD-10-CM | POA: Diagnosis not present

## 2012-10-05 DIAGNOSIS — H4011X0 Primary open-angle glaucoma, stage unspecified: Secondary | ICD-10-CM | POA: Diagnosis not present

## 2012-10-18 DIAGNOSIS — Z125 Encounter for screening for malignant neoplasm of prostate: Secondary | ICD-10-CM | POA: Diagnosis not present

## 2012-10-18 DIAGNOSIS — Z79899 Other long term (current) drug therapy: Secondary | ICD-10-CM | POA: Diagnosis not present

## 2012-10-18 DIAGNOSIS — J45909 Unspecified asthma, uncomplicated: Secondary | ICD-10-CM | POA: Diagnosis not present

## 2012-10-18 DIAGNOSIS — Z0000 Encounter for general adult medical examination without abnormal findings: Secondary | ICD-10-CM | POA: Diagnosis not present

## 2012-10-18 DIAGNOSIS — J984 Other disorders of lung: Secondary | ICD-10-CM | POA: Diagnosis not present

## 2012-10-18 DIAGNOSIS — Z6829 Body mass index (BMI) 29.0-29.9, adult: Secondary | ICD-10-CM | POA: Diagnosis not present

## 2012-11-16 DIAGNOSIS — H40059 Ocular hypertension, unspecified eye: Secondary | ICD-10-CM | POA: Diagnosis not present

## 2012-11-16 DIAGNOSIS — H409 Unspecified glaucoma: Secondary | ICD-10-CM | POA: Diagnosis not present

## 2012-11-16 DIAGNOSIS — H4011X0 Primary open-angle glaucoma, stage unspecified: Secondary | ICD-10-CM | POA: Diagnosis not present

## 2012-12-08 DIAGNOSIS — H409 Unspecified glaucoma: Secondary | ICD-10-CM | POA: Diagnosis not present

## 2012-12-08 DIAGNOSIS — H4011X0 Primary open-angle glaucoma, stage unspecified: Secondary | ICD-10-CM | POA: Diagnosis not present

## 2012-12-08 DIAGNOSIS — H2510 Age-related nuclear cataract, unspecified eye: Secondary | ICD-10-CM | POA: Diagnosis not present

## 2012-12-24 DIAGNOSIS — H409 Unspecified glaucoma: Secondary | ICD-10-CM | POA: Diagnosis not present

## 2012-12-24 DIAGNOSIS — H4011X0 Primary open-angle glaucoma, stage unspecified: Secondary | ICD-10-CM | POA: Diagnosis not present

## 2012-12-24 DIAGNOSIS — H2510 Age-related nuclear cataract, unspecified eye: Secondary | ICD-10-CM | POA: Diagnosis not present

## 2012-12-31 DIAGNOSIS — Z23 Encounter for immunization: Secondary | ICD-10-CM | POA: Diagnosis not present

## 2013-04-22 DIAGNOSIS — H4011X0 Primary open-angle glaucoma, stage unspecified: Secondary | ICD-10-CM | POA: Diagnosis not present

## 2013-04-22 DIAGNOSIS — H409 Unspecified glaucoma: Secondary | ICD-10-CM | POA: Diagnosis not present

## 2013-07-29 DIAGNOSIS — H4011X0 Primary open-angle glaucoma, stage unspecified: Secondary | ICD-10-CM | POA: Diagnosis not present

## 2013-07-29 DIAGNOSIS — H409 Unspecified glaucoma: Secondary | ICD-10-CM | POA: Diagnosis not present

## 2013-07-29 DIAGNOSIS — H2510 Age-related nuclear cataract, unspecified eye: Secondary | ICD-10-CM | POA: Diagnosis not present

## 2013-08-12 DIAGNOSIS — H4011X0 Primary open-angle glaucoma, stage unspecified: Secondary | ICD-10-CM | POA: Diagnosis not present

## 2013-08-12 DIAGNOSIS — H409 Unspecified glaucoma: Secondary | ICD-10-CM | POA: Diagnosis not present

## 2014-01-05 ENCOUNTER — Other Ambulatory Visit (HOSPITAL_COMMUNITY): Payer: Self-pay | Admitting: Family Medicine

## 2014-01-05 DIAGNOSIS — Z0000 Encounter for general adult medical examination without abnormal findings: Secondary | ICD-10-CM

## 2014-01-05 DIAGNOSIS — Z23 Encounter for immunization: Secondary | ICD-10-CM | POA: Diagnosis not present

## 2014-01-05 DIAGNOSIS — Z6831 Body mass index (BMI) 31.0-31.9, adult: Secondary | ICD-10-CM | POA: Diagnosis not present

## 2014-01-05 DIAGNOSIS — E782 Mixed hyperlipidemia: Secondary | ICD-10-CM | POA: Diagnosis not present

## 2014-01-05 DIAGNOSIS — Z125 Encounter for screening for malignant neoplasm of prostate: Secondary | ICD-10-CM | POA: Diagnosis not present

## 2014-01-05 DIAGNOSIS — Z87891 Personal history of nicotine dependence: Secondary | ICD-10-CM

## 2014-01-05 DIAGNOSIS — E6609 Other obesity due to excess calories: Secondary | ICD-10-CM | POA: Diagnosis not present

## 2014-01-05 DIAGNOSIS — R7301 Impaired fasting glucose: Secondary | ICD-10-CM | POA: Diagnosis not present

## 2014-01-10 ENCOUNTER — Ambulatory Visit (HOSPITAL_COMMUNITY)
Admission: RE | Admit: 2014-01-10 | Discharge: 2014-01-10 | Disposition: A | Discharge status: Home / Self Care | Payer: BC Managed Care – PPO | Source: Ambulatory Visit | Attending: Family Medicine | Admitting: Family Medicine

## 2014-01-10 DIAGNOSIS — Z1389 Encounter for screening for other disorder: Secondary | ICD-10-CM | POA: Insufficient documentation

## 2014-01-10 DIAGNOSIS — Z87891 Personal history of nicotine dependence: Secondary | ICD-10-CM

## 2014-01-10 DIAGNOSIS — Z136 Encounter for screening for cardiovascular disorders: Secondary | ICD-10-CM | POA: Diagnosis not present

## 2014-01-10 DIAGNOSIS — Z0000 Encounter for general adult medical examination without abnormal findings: Secondary | ICD-10-CM

## 2014-03-13 DIAGNOSIS — J180 Bronchopneumonia, unspecified organism: Secondary | ICD-10-CM | POA: Diagnosis not present

## 2014-03-13 DIAGNOSIS — Z6831 Body mass index (BMI) 31.0-31.9, adult: Secondary | ICD-10-CM | POA: Diagnosis not present

## 2014-03-13 DIAGNOSIS — E6609 Other obesity due to excess calories: Secondary | ICD-10-CM | POA: Diagnosis not present

## 2014-03-13 DIAGNOSIS — J45909 Unspecified asthma, uncomplicated: Secondary | ICD-10-CM | POA: Diagnosis not present

## 2014-04-21 DIAGNOSIS — H4011X1 Primary open-angle glaucoma, mild stage: Secondary | ICD-10-CM | POA: Diagnosis not present

## 2014-04-21 DIAGNOSIS — H4011X3 Primary open-angle glaucoma, severe stage: Secondary | ICD-10-CM | POA: Diagnosis not present

## 2014-04-21 DIAGNOSIS — H2513 Age-related nuclear cataract, bilateral: Secondary | ICD-10-CM | POA: Diagnosis not present

## 2014-05-23 DIAGNOSIS — H2511 Age-related nuclear cataract, right eye: Secondary | ICD-10-CM | POA: Diagnosis not present

## 2014-05-23 DIAGNOSIS — H25811 Combined forms of age-related cataract, right eye: Secondary | ICD-10-CM | POA: Diagnosis not present

## 2014-07-17 DIAGNOSIS — J069 Acute upper respiratory infection, unspecified: Secondary | ICD-10-CM | POA: Diagnosis not present

## 2014-07-17 DIAGNOSIS — Z6831 Body mass index (BMI) 31.0-31.9, adult: Secondary | ICD-10-CM | POA: Diagnosis not present

## 2014-07-17 DIAGNOSIS — E6609 Other obesity due to excess calories: Secondary | ICD-10-CM | POA: Diagnosis not present

## 2014-07-17 DIAGNOSIS — J45901 Unspecified asthma with (acute) exacerbation: Secondary | ICD-10-CM | POA: Diagnosis not present

## 2014-07-17 DIAGNOSIS — J209 Acute bronchitis, unspecified: Secondary | ICD-10-CM | POA: Diagnosis not present

## 2014-08-31 DIAGNOSIS — H4011X3 Primary open-angle glaucoma, severe stage: Secondary | ICD-10-CM | POA: Diagnosis not present

## 2014-08-31 DIAGNOSIS — H4011X1 Primary open-angle glaucoma, mild stage: Secondary | ICD-10-CM | POA: Diagnosis not present

## 2014-11-23 DIAGNOSIS — H4011X1 Primary open-angle glaucoma, mild stage: Secondary | ICD-10-CM | POA: Diagnosis not present

## 2015-01-05 DIAGNOSIS — Z23 Encounter for immunization: Secondary | ICD-10-CM | POA: Diagnosis not present

## 2015-04-17 DIAGNOSIS — J209 Acute bronchitis, unspecified: Secondary | ICD-10-CM | POA: Diagnosis not present

## 2015-04-17 DIAGNOSIS — J069 Acute upper respiratory infection, unspecified: Secondary | ICD-10-CM | POA: Diagnosis not present

## 2015-04-17 DIAGNOSIS — E6609 Other obesity due to excess calories: Secondary | ICD-10-CM | POA: Diagnosis not present

## 2015-04-17 DIAGNOSIS — Z6831 Body mass index (BMI) 31.0-31.9, adult: Secondary | ICD-10-CM | POA: Diagnosis not present

## 2015-04-17 DIAGNOSIS — Z1389 Encounter for screening for other disorder: Secondary | ICD-10-CM | POA: Diagnosis not present

## 2015-04-17 DIAGNOSIS — J4540 Moderate persistent asthma, uncomplicated: Secondary | ICD-10-CM | POA: Diagnosis not present

## 2015-05-03 DIAGNOSIS — E6609 Other obesity due to excess calories: Secondary | ICD-10-CM | POA: Diagnosis not present

## 2015-05-03 DIAGNOSIS — J4540 Moderate persistent asthma, uncomplicated: Secondary | ICD-10-CM | POA: Diagnosis not present

## 2015-05-03 DIAGNOSIS — Z1389 Encounter for screening for other disorder: Secondary | ICD-10-CM | POA: Diagnosis not present

## 2015-05-03 DIAGNOSIS — Z6831 Body mass index (BMI) 31.0-31.9, adult: Secondary | ICD-10-CM | POA: Diagnosis not present

## 2015-05-03 DIAGNOSIS — R7309 Other abnormal glucose: Secondary | ICD-10-CM | POA: Diagnosis not present

## 2015-05-03 DIAGNOSIS — Z125 Encounter for screening for malignant neoplasm of prostate: Secondary | ICD-10-CM | POA: Diagnosis not present

## 2015-05-03 DIAGNOSIS — E782 Mixed hyperlipidemia: Secondary | ICD-10-CM | POA: Diagnosis not present

## 2015-05-24 DIAGNOSIS — Z961 Presence of intraocular lens: Secondary | ICD-10-CM | POA: Diagnosis not present

## 2015-05-24 DIAGNOSIS — H401113 Primary open-angle glaucoma, right eye, severe stage: Secondary | ICD-10-CM | POA: Diagnosis not present

## 2015-05-24 DIAGNOSIS — H401122 Primary open-angle glaucoma, left eye, moderate stage: Secondary | ICD-10-CM | POA: Diagnosis not present

## 2015-06-01 DIAGNOSIS — Z1389 Encounter for screening for other disorder: Secondary | ICD-10-CM | POA: Diagnosis not present

## 2015-06-01 DIAGNOSIS — Z0000 Encounter for general adult medical examination without abnormal findings: Secondary | ICD-10-CM | POA: Diagnosis not present

## 2015-06-01 DIAGNOSIS — Z23 Encounter for immunization: Secondary | ICD-10-CM | POA: Diagnosis not present

## 2015-06-01 DIAGNOSIS — E6609 Other obesity due to excess calories: Secondary | ICD-10-CM | POA: Diagnosis not present

## 2015-06-01 DIAGNOSIS — Z1211 Encounter for screening for malignant neoplasm of colon: Secondary | ICD-10-CM | POA: Diagnosis not present

## 2015-06-01 DIAGNOSIS — Z6830 Body mass index (BMI) 30.0-30.9, adult: Secondary | ICD-10-CM | POA: Diagnosis not present

## 2015-11-22 DIAGNOSIS — H401113 Primary open-angle glaucoma, right eye, severe stage: Secondary | ICD-10-CM | POA: Diagnosis not present

## 2016-01-10 DIAGNOSIS — Z23 Encounter for immunization: Secondary | ICD-10-CM | POA: Diagnosis not present

## 2016-04-23 DIAGNOSIS — R7309 Other abnormal glucose: Secondary | ICD-10-CM | POA: Diagnosis not present

## 2016-04-23 DIAGNOSIS — Z6831 Body mass index (BMI) 31.0-31.9, adult: Secondary | ICD-10-CM | POA: Diagnosis not present

## 2016-04-23 DIAGNOSIS — K469 Unspecified abdominal hernia without obstruction or gangrene: Secondary | ICD-10-CM | POA: Diagnosis not present

## 2016-04-23 DIAGNOSIS — E6609 Other obesity due to excess calories: Secondary | ICD-10-CM | POA: Diagnosis not present

## 2016-04-23 DIAGNOSIS — Z1389 Encounter for screening for other disorder: Secondary | ICD-10-CM | POA: Diagnosis not present

## 2016-05-21 DIAGNOSIS — H401113 Primary open-angle glaucoma, right eye, severe stage: Secondary | ICD-10-CM | POA: Diagnosis not present

## 2016-07-16 ENCOUNTER — Ambulatory Visit (INDEPENDENT_AMBULATORY_CARE_PROVIDER_SITE_OTHER): Payer: Medicare Other | Admitting: Urology

## 2016-07-16 DIAGNOSIS — R102 Pelvic and perineal pain: Secondary | ICD-10-CM | POA: Diagnosis not present

## 2016-07-16 DIAGNOSIS — N471 Phimosis: Secondary | ICD-10-CM | POA: Diagnosis not present

## 2016-07-31 ENCOUNTER — Encounter: Payer: Self-pay | Admitting: General Surgery

## 2016-07-31 ENCOUNTER — Ambulatory Visit (INDEPENDENT_AMBULATORY_CARE_PROVIDER_SITE_OTHER): Payer: Medicare Other | Admitting: General Surgery

## 2016-07-31 VITALS — BP 190/85 | HR 70 | Temp 97.5°F | Resp 18 | Ht 66.0 in | Wt 198.0 lb

## 2016-07-31 DIAGNOSIS — K4091 Unilateral inguinal hernia, without obstruction or gangrene, recurrent: Secondary | ICD-10-CM | POA: Diagnosis not present

## 2016-07-31 NOTE — Patient Instructions (Signed)

## 2016-07-31 NOTE — Progress Notes (Signed)
Mark Harris; [id number]; 1940/12/10   HPI Patient is a 76 year old white male who was referred to my care by Dr. Alyson Ingles for evaluation and treatment of a right inguinal hernia. The patient is undergoing a circumcision by him. Patient is status post right internal herniorrhaphy with mesh in 2014. He states over the past few years, he has had intermittent pain and swelling in the right inguinal region. The pain is made worse with movement or coughing. It does seem to be inhibiting his exercise routine. He currently has no pain. No nausea or vomiting have been noted. Past Medical History:  Diagnosis Date  . Asthma   . Glaucoma    right eye  . Kidney stones     Past Surgical History:  Procedure Laterality Date  . HERNIA REPAIR     umbilical  . INGUINAL HERNIA REPAIR Right 06/25/2012   Procedure: HERNIA REPAIR INGUINAL ADULT;  Surgeon: Donato Heinz, MD;  Location: [facility];  Service: General;  Laterality: Right;  . INSERTION OF MESH Right 06/25/2012   Procedure: INSERTION OF MESH;  Surgeon: Donato Heinz, MD;  Location: [facility];  Service: General;  Laterality: Right;    No family history on file.  Current Outpatient Prescriptions on File Prior to Visit  Medication Sig Dispense Refill  . albuterol (PROVENTIL HFA;VENTOLIN HFA) 108 (90 BASE) MCG/ACT inhaler Inhale 2 puffs into the lungs every 6 (six) hours as needed. asthma    . bimatoprost (LUMIGAN) 0.01 % SOLN Place 1 drop into both eyes at bedtime.    . brimonidine-timolol (COMBIGAN) 0.2-0.5 % ophthalmic solution Place 1 drop into the right eye every 12 (twelve) hours. Prescription has not been picked up pending insurance    . Fluticasone-Salmeterol (ADVAIR) 250-50 MCG/DOSE AEPB Inhale 1 puff into the lungs 2 (two) times daily as needed. asthma    . HYDROcodone-acetaminophen (NORCO) 5-325 MG per tablet Take 1-2 tablets by mouth every 4 (four) hours as needed for pain. 45 tablet 0   No current facility-administered medications on  file prior to visit.     Allergies  Allergen Reactions  . Peanuts [Peanut Oil]     Patient states his throat closed up, also happened with some Trail Mix    History  Alcohol Use  . Yes    Comment: one glass of wine a day    History  Smoking Status  . Former Smoker  Smokeless Tobacco  . Former Systems developer    Review of Systems  Constitutional: Negative.   HENT: Negative.   Eyes: Positive for blurred vision.  Respiratory: Negative.   Cardiovascular: Negative.   Gastrointestinal: Negative.   Genitourinary: Negative.   Musculoskeletal: Negative.   Skin: Negative.   Neurological: Negative.   Endo/Heme/Allergies: Negative.   Psychiatric/Behavioral: Negative.     Objective   Vitals:   07/31/16 0940  BP: (!) 190/85  Pulse: 70  Resp: 18  Temp: 97.5 F (36.4 C)    Physical Exam  Constitutional: He is oriented to person, place, and time and well-developed, well-nourished, and in no distress.  HENT:  Head: Normocephalic and atraumatic.  Neck: Normal range of motion. Neck supple.  Cardiovascular: Normal rate, regular rhythm and normal heart sounds.   No murmur heard. Pulmonary/Chest: Effort normal and breath sounds normal. He has no wheezes.  Abdominal: Soft. Bowel sounds are normal. He exhibits no distension. There is tenderness.  Patient does have a small bulge with discomfort to palpation in the right inguinal region. Patient also has a  large left renal hernia. It is reducible.  Neurological: He is alert and oriented to person, place, and time.  Skin: Skin is warm and dry.  Vitals reviewed.  Dr. Noland Fordyce notes reviewed Assessment  Recurrent right inguinal hernia, symptomatic Asymptomatic left inguinal hernia Plan   We'll coordinate right inguinal herniorrhaphy with mesh, recurrent with the patient's circumcision by urology. The risks and benefits of the procedure including bleeding, infection, mesh use, and the possibility of recurrence of the hernia were fully  explained to the patient, who gave informed consent.

## 2016-08-12 DIAGNOSIS — E6609 Other obesity due to excess calories: Secondary | ICD-10-CM | POA: Diagnosis not present

## 2016-08-12 DIAGNOSIS — Z1389 Encounter for screening for other disorder: Secondary | ICD-10-CM | POA: Diagnosis not present

## 2016-08-12 DIAGNOSIS — Z6831 Body mass index (BMI) 31.0-31.9, adult: Secondary | ICD-10-CM | POA: Diagnosis not present

## 2016-08-12 DIAGNOSIS — I10 Essential (primary) hypertension: Secondary | ICD-10-CM | POA: Diagnosis not present

## 2016-08-12 DIAGNOSIS — R7309 Other abnormal glucose: Secondary | ICD-10-CM | POA: Diagnosis not present

## 2016-08-12 DIAGNOSIS — Z125 Encounter for screening for malignant neoplasm of prostate: Secondary | ICD-10-CM | POA: Diagnosis not present

## 2016-08-18 ENCOUNTER — Other Ambulatory Visit: Payer: Self-pay | Admitting: Urology

## 2016-08-19 NOTE — H&P (Signed)
Mark Harris; [id number]; 05/06/1940   HPI Patient is a 76 year old white male who was referred to my care by Dr. Alyson Ingles for evaluation and treatment of a right inguinal hernia. The patient is undergoing a circumcision by him. Patient is status post right internal herniorrhaphy with mesh in 2014. He states over the past few years, he has had intermittent pain and swelling in the right inguinal region. The pain is made worse with movement or coughing. It does seem to be inhibiting his exercise routine. He currently has no pain. No nausea or vomiting have been noted.     Past Medical History:  Diagnosis Date  . Asthma   . Glaucoma    right eye  . Kidney stones          Past Surgical History:  Procedure Laterality Date  . HERNIA REPAIR     umbilical  . INGUINAL HERNIA REPAIR Right 06/25/2012   Procedure: HERNIA REPAIR INGUINAL ADULT;  Surgeon: Donato Heinz, MD;  Location: [facility];  Service: General;  Laterality: Right;  . INSERTION OF MESH Right 06/25/2012   Procedure: INSERTION OF MESH;  Surgeon: Donato Heinz, MD;  Location: [facility];  Service: General;  Laterality: Right;    No family history on file.        Current Outpatient Prescriptions on File Prior to Visit  Medication Sig Dispense Refill  . albuterol (PROVENTIL HFA;VENTOLIN HFA) 108 (90 BASE) MCG/ACT inhaler Inhale 2 puffs into the lungs every 6 (six) hours as needed. asthma    . bimatoprost (LUMIGAN) 0.01 % SOLN Place 1 drop into both eyes at bedtime.    . brimonidine-timolol (COMBIGAN) 0.2-0.5 % ophthalmic solution Place 1 drop into the right eye every 12 (twelve) hours. Prescription has not been picked up pending insurance    . Fluticasone-Salmeterol (ADVAIR) 250-50 MCG/DOSE AEPB Inhale 1 puff into the lungs 2 (two) times daily as needed. asthma    . HYDROcodone-acetaminophen (NORCO) 5-325 MG per tablet Take 1-2 tablets by mouth every 4 (four) hours as needed for pain. 45 tablet 0   No  current facility-administered medications on file prior to visit.          Allergies  Allergen Reactions  . Peanuts [Peanut Oil]     Patient states his throat closed up, also happened with some Trail Mix        History  Alcohol Use  . Yes    Comment: one glass of wine a day       History  Smoking Status  . Former Smoker  Smokeless Tobacco  . Former Systems developer    Review of Systems  Constitutional: Negative.   HENT: Negative.   Eyes: Positive for blurred vision.  Respiratory: Negative.   Cardiovascular: Negative.   Gastrointestinal: Negative.   Genitourinary: Negative.   Musculoskeletal: Negative.   Skin: Negative.   Neurological: Negative.   Endo/Heme/Allergies: Negative.   Psychiatric/Behavioral: Negative.     Objective      Vitals:   07/31/16 0940  BP: (!) 190/85  Pulse: 70  Resp: 18  Temp: 97.5 F (36.4 C)    Physical Exam  Constitutional: He is oriented to person, place, and time and well-developed, well-nourished, and in no distress.  HENT:  Head: Normocephalic and atraumatic.  Neck: Normal range of motion. Neck supple.  Cardiovascular: Normal rate, regular rhythm and normal heart sounds.   No murmur heard. Pulmonary/Chest: Effort normal and breath sounds normal. He has no wheezes.  Abdominal: Soft. Bowel  sounds are normal. He exhibits no distension. There is tenderness.  Patient does have a small bulge with discomfort to palpation in the right inguinal region. Patient also has a large left inguinal hernia. It is reducible.  Neurological: He is alert and oriented to person, place, and time.  Skin: Skin is warm and dry.  Vitals reviewed.  Dr. Noland Fordyce notes reviewed Assessment  Recurrent right inguinal hernia, symptomatic Asymptomatic left inguinal hernia Plan   We'll coordinate right inguinal herniorrhaphy with mesh, recurrent with the patient's circumcision by urology. The risks and benefits of the procedure including  bleeding, infection, mesh use, and the possibility of recurrence of the hernia were fully explained to the patient, who gave informed consent.

## 2016-08-22 DIAGNOSIS — Z1211 Encounter for screening for malignant neoplasm of colon: Secondary | ICD-10-CM | POA: Diagnosis not present

## 2016-08-22 NOTE — Patient Instructions (Signed)
Mark Harris  08/22/2016     @[email or web address] @   Your procedure is scheduled on  09/01/2016   Report to [facility] at  730  A.M.  Call this number if you have problems the morning of surgery:  [phone]   Remember:  Do not eat food or drink liquids after midnight.  Take these medicines the morning of surgery with A SIP OF WATER  Hydrocodone.   Do not wear jewelry, make-up or nail polish.  Do not wear lotions, powders, or perfumes, or deoderant.  Do not shave 48 hours prior to surgery.  Men may shave face and neck.  Do not bring valuables to the hospital.  [facility] is not responsible for any belongings or valuables.  Contacts, dentures or bridgework may not be worn into surgery.  Leave your suitcase in the car.  After surgery it may be brought to your room.  For patients admitted to the hospital, discharge time will be determined by your treatment team.  Patients discharged the day of surgery will not be allowed to drive home.   Name and phone number of your driver:   family Special instructions:  None  Please read over the following fact sheets that you were given. Anesthesia Post-op Instructions and Care and Recovery After Surgery       Open Hernia Repair, Adult Open hernia repair is a surgical procedure to fix a hernia. A hernia occurs when an internal organ or tissue pushes out through a weak spot in the abdominal wall muscles. Hernias commonly occur in the groin and around the navel. Most hernias tend to get worse over time. Often, surgery is done to prevent the hernia from becoming bigger, uncomfortable, or an emergency. Emergency surgery may be needed if abdominal contents get stuck in the opening (incarcerated hernia) or the blood supply gets cut off (strangulated hernia). In an open repair, an incision is made in the abdomen to perform the surgery. Tell a health care provider about:  Any allergies you have.  All medicines you  are taking, including vitamins, herbs, eye drops, creams, and over-the-counter medicines.  Any problems you or family members have had with anesthetic medicines.  Any blood or bone disorders you have.  Any surgeries you have had.  Any medical conditions you have, including any recent cold or flu symptoms.  Whether you are pregnant or may be pregnant. What are the risks? Generally, this is a safe procedure. However, problems may occur, including:  Long-lasting (chronic) pain.  Bleeding.  Infection.  Damage to the testicle. This can cause shrinking or swelling.  Damage to the bladder, blood vessels, intestine, or nerves near the hernia.  Trouble passing urine.  Allergic reactions to medicines.  Return of the hernia.  What happens before the procedure? Staying hydrated Follow instructions from your health care provider about hydration, which may include:  Up to 2 hours before the procedure - you may continue to drink clear liquids, such as water, clear fruit juice, black coffee, and plain tea.  Eating and drinking restrictions Follow instructions from your health care provider about eating and drinking, which may include:  8 hours before the procedure - stop eating heavy meals or foods such as meat, fried foods, or fatty foods.  6 hours before the procedure - stop eating light meals or foods, such as toast or cereal.  6 hours before the procedure - stop drinking milk  or drinks that contain milk.  2 hours before the procedure - stop drinking clear liquids.  Medicines  Ask your health care provider about: ? Changing or stopping your regular medicines. This is especially important if you are taking diabetes medicines or blood thinners. ? Taking medicines such as aspirin and ibuprofen. These medicines can thin your blood. Do not take these medicines before your procedure if your health care provider instructs you not to.  You may be given antibiotic medicine to help  prevent infection. General instructions  You may have blood tests or imaging studies.  Ask your health care provider how your surgical site will be marked or identified.  If you smoke, do not smoke for at least 2 weeks before your procedure or for as long as told by your health care provider.  Let your health care provider know if you develop a cold or any infection before your surgery.  Plan to have someone take you home from the hospital or clinic.  If you will be going home right after the procedure, plan to have someone with you for 24 hours. What happens during the procedure?  To reduce your risk of infection: ? Your health care team will wash or sanitize their hands. ? Your skin will be washed with soap. ? Hair may be removed from the surgical area.  An IV tube will be inserted into one of your veins.  You will be given one or more of the following: ? A medicine to help you relax (sedative). ? A medicine to numb the area (local anesthetic). ? A medicine to make you fall asleep (general anesthetic).  Your surgeon will make an incision over the hernia.  The tissues of the hernia will be moved back into place.  The edges of the hernia may be stitched together.  The opening in the abdominal muscles will be closed with stitches (sutures). Or, your surgeon will place a mesh patch made of manmade (synthetic) material over the opening.  The incision will be closed.  A bandage (dressing) may be placed over the incision. The procedure may vary among health care providers and hospitals. What happens after the procedure?  Your blood pressure, heart rate, breathing rate, and blood oxygen level will be monitored until the medicines you were given have worn off.  You may be given medicine for pain.  Do not drive for 24 hours if you received a sedative. This information is not intended to replace advice given to you by your health care provider. Make sure you discuss any  questions you have with your health care provider. Document Released: 08/27/2000 Document Revised: 09/21/2015 Document Reviewed: 08/15/2015 Elsevier Interactive Patient Education  2018 [place], Adult, Care After These instructions give you information about caring for yourself after your procedure. Your doctor may also give you more specific instructions. If you have problems or questions, contact your doctor. Follow these instructions at home: Surgical cut (incision) care   Follow instructions from your doctor about how to take care of your surgical cut area. Make sure you: ? Wash your hands with soap and water before you change your bandage (dressing). If you cannot use soap and water, use hand sanitizer. ? Change your bandage as told by your doctor. ? Leave stitches (sutures), skin glue, or skin tape (adhesive) strips in place. They may need to stay in place for 2 weeks or longer. If tape strips get loose and curl up, you may trim the  loose edges. Do not remove tape strips completely unless your doctor says it is okay.  Check your surgical cut every day for signs of infection. Check for: ? More redness, swelling, or pain. ? More fluid or blood. ? Warmth. ? Pus or a bad smell. Activity  Do not drive or use heavy machinery while taking prescription pain medicine. Do not drive until your doctor says it is okay.  Until your doctor says it is okay: ? Do not lift anything that is heavier than 10 lb (4.5 kg). ? Do not play contact sports.  Return to your normal activities as told by your doctor. Ask your doctor what activities are safe. General instructions  To prevent or treat having a hard time pooping (constipation) while you are taking prescription pain medicine, your doctor may recommend that you: ? Drink enough fluid to keep your pee (urine) clear or pale yellow. ? Take over-the-counter or prescription medicines. ? Eat foods that are high in fiber, such  as fresh fruits and vegetables, whole grains, and beans. ? Limit foods that are high in fat and processed sugars, such as fried and sweet foods.  Take over-the-counter and prescription medicines only as told by your doctor.  Do not take baths, swim, or use a hot tub until your doctor says it is okay.  Keep all follow-up visits as told by your doctor. This is important. Contact a doctor if:  You develop a rash.  You have more redness, swelling, or pain around your surgical cut.  You have more fluid or blood coming from your surgical cut.  Your surgical cut feels warm to the touch.  You have pus or a bad smell coming from your surgical cut.  You have a fever or chills.  You have blood in your poop (stool).  You have not pooped in 2-3 days.  Medicine does not help your pain. Get help right away if:  You have chest pain or you are short of breath.  You feel light-headed.  You feel weak and dizzy (feel faint).  You have very bad pain.  You throw up (vomit) and your pain is worse. This information is not intended to replace advice given to you by your health care provider. Make sure you discuss any questions you have with your health care provider. Document Released: 03/24/2014 Document Revised: 09/21/2015 Document Reviewed: 08/15/2015 Elsevier Interactive Patient Education  2017 [employer].  Circumcision, Adult Circumcision is a surgery to remove the foreskin of the penis or to cut the foreskin so the space between skin and the tip of the penis is larger. When only the foreskin is cut, it is called a dorsal incision. A dorsal circumcision leaves the entire foreskin but makes the end of the foreskin looser so it can be pulled back over the head of the penis. Tell a health care provider about:  Any allergies you have.  All medicines you are taking, including vitamins, herbs, eye drops, creams, and over-the-counter medicines.  Any problems you or family members have had  with anesthetic medicines.  Any blood disorders you have.  Any surgeries you have had.  Any medical conditions you have, including a cold or other infection. What are the risks? Generally, this is a safe procedure. However, problems may occur, including:  Bleeding.  Infection.  Pain.  Urethral injury. The urethra is a tube that ends at the tip of the penis and carries urine out of your body.  Opening of the surgical wound. This  can occur from an unwanted erection after surgery.  Allergic reactions to medicines.  What happens before the procedure? Staying hydrated Follow instructions from your health care provider about hydration, which may include:  Up to 2 hours before the procedure - you may continue to drink clear liquids, such as water, clear fruit juice, black coffee, and plain tea.  Eating and drinking restrictions Follow instructions from your health care provider about eating and drinking, which may include:  8 hours before the procedure - stop eating heavy meals or foods such as meat, fried foods, or fatty foods.  6 hours before the procedure - stop eating light meals or foods, such as toast or cereal.  6 hours before the procedure - stop drinking milk or drinks that contain milk.  2 hours before the procedure - stop drinking clear liquids.  Medicines  Ask your health care provider about: ? Changing or stopping your regular medicines. This is especially important if you take diabetes medicines or blood thinners. ? Taking medicines such as aspirin and ibuprofen. These medicines can thin your blood. Do not take these medicines before your procedure if your doctor instructs you not to.  You may be given antibiotic medicine to help prevent infection. General instructions  If you will be going home right after the procedure, plan to have someone with you for 24 hours.  Plan to have someone take you home from the hospital or clinic.  Ask your health care  provider how your surgical site will be marked or identified.  You may be asked to shower with a germ-killing soap. What happens during the procedure?  To reduce your risk of infection: ? Your health care team will wash or sanitize their hands. ? Your skin will be washed with soap.  An IV tube may be inserted into one of your veins.  You may be given medicine to help you relax (sedative).  An anesthetic will be injected with a needle into the skin of your penis (local anesthetic) to numb the nerves of your foreskin.  An incision will be made to remove or cut the foreskin.  Absorbable stitches (sutures) may be used to close the incision.  A bandage (dressing) will be applied to the incision site. The procedure may vary among health care providers and hospitals. What happens after the procedure?  Your blood pressure, heart rate, breathing rate, and blood oxygen level will be monitored until the medicines you were given have worn off.  Do not get out of bed until your health care provider approves.  Do not drive for 24 hours after the procedure if you were given a sedative. Summary  Circumcision is a surgery to remove the foreskin of the penis or to cut the foreskin so the opening is larger.  Absorbable sutures may be used to close the incision after the foreskin has been removed or cut.  If you will be going home right after the procedure, plan to have someone with you for 24 hours. This information is not intended to replace advice given to you by your health care provider. Make sure you discuss any questions you have with your health care provider. Document Released: 03/23/2007 Document Revised: 01/21/2016 Document Reviewed: 01/21/2016 Elsevier Interactive Patient Education  2017 [place].  Circumcision, Adult, Care After This sheet gives you information about how to care for yourself after your procedure. Your doctor may also give you more specific instructions. If you  have problems or questions, contact your doctor. Follow  these instructions at home: Cut care  Follow instructions from your doctor about how to take care of your cut from surgery (incision). Make sure you: ? Wash your hands with soap and water before you change your bandage (dressing). If you cannot use soap and water, use hand sanitizer. ? Change your bandage as told by your doctor. ? Leave stitches (sutures), skin glue, or skin tape (adhesive) strips in place. They may need to stay in place for 2 weeks or longer. If tape strips get loose and curl up, you may trim the loose edges. Do not remove tape strips completely unless your doctor says it is okay  Check your cut area every day for signs of infection. Check for: ? More redness, swelling, or pain. ? More fluid or blood. ? Warmth. ? Pus or a bad smell. Bathing  Do not take baths, swim, or use a hot tub until your doctor says it is okay. Ask your doctor if you can take showers. You may only be allowed to take sponge baths for bathing.  Do not get your cut area wet during the 24 hours after the procedure, or as long as told by your doctor.  When you can shower, do not rub the cut area. Gently pat it dry. General instructions  Avoid heavy lifting, contact sports, biking, or swimming until your doctor says it is okay.  Do not have sex until your doctor says it is okay.  Take or apply over-the-counter and prescription medicines only as told by your doctor.  Keep all follow-up visits as told by your doctor. This is important. Contact a doctor if:  Medicine does not help your pain.  You have more redness, swelling, or pain around your cut.  You have more fluid or blood coming from your cut.  Your cut feels warm to the touch.  You have pus or a bad smell coming from your cut.  You have a fever. Get help right away if:  You cannot pee (urinate).  It hurts to pee.  Your pain is not helped by medicines.  There is redness,  swelling, and soreness that spreads up the shaft of your penis, your thighs, or your lower belly (abdomen).  You have bleeding that does not stop when you press on it. Summary  Follow instructions from your doctor about how to take care of your cut from surgery (incision).  Check your cut area every day for signs of infection.  Do not have sex until your doctor says it is okay. This information is not intended to replace advice given to you by your health care provider. Make sure you discuss any questions you have with your health care provider. Document Released: 08/20/2007 Document Revised: 03/11/2016 Document Reviewed: 03/11/2016 Elsevier Interactive Patient Education  2017 [place] Anesthesia, Adult General anesthesia is the use of medicines to make a person "go to sleep" (be unconscious) for a medical procedure. General anesthesia is often recommended when a procedure:  Is long.  Requires you to be still or in an unusual position.  Is major and can cause you to lose blood.  Is impossible to do without general anesthesia.  The medicines used for general anesthesia are called general anesthetics. In addition to making you sleep, the medicines:  Prevent pain.  Control your blood pressure.  Relax your muscles.  Tell a health care provider about:  Any allergies you have.  All medicines you are taking, including vitamins, herbs, eye drops, creams, and  over-the-counter medicines.  Any problems you or family members have had with anesthetic medicines.  Types of anesthetics you have had in the past.  Any bleeding disorders you have.  Any surgeries you have had.  Any medical conditions you have.  Any history of heart or lung conditions, such as heart failure, sleep apnea, or chronic obstructive pulmonary disease (COPD).  Whether you are pregnant or may be pregnant.  Whether you use tobacco, alcohol, marijuana, or street drugs.  Any history of [employer].  Any history of depression or anxiety. What are the risks? Generally, this is a safe procedure. However, problems may occur, including:  Allergic reaction to anesthetics.  Lung and heart problems.  Inhaling food or liquids from your stomach into your lungs (aspiration).  Injury to nerves.  Waking up during your procedure and being unable to move (rare).  Extreme agitation or a state of mental confusion (delirium) when you wake up from the anesthetic.  Air in the bloodstream, which can lead to stroke.  These problems are more likely to develop if you are having a major surgery or if you have an advanced medical condition. You can prevent some of these complications by answering all of your health care provider's questions thoroughly and by following all pre-procedure instructions. General anesthesia can cause side effects, including:  Nausea or vomiting  A sore throat from the breathing tube.  Feeling cold or shivery.  Feeling tired, washed out, or achy.  Sleepiness or drowsiness.  Confusion or agitation.  What happens before the procedure? Staying hydrated Follow instructions from your health care provider about hydration, which may include:  Up to 2 hours before the procedure - you may continue to drink clear liquids, such as water, clear fruit juice, black coffee, and plain tea.  Eating and drinking restrictions Follow instructions from your health care provider about eating and drinking, which may include:  8 hours before the procedure - stop eating heavy meals or foods such as meat, fried foods, or fatty foods.  6 hours before the procedure - stop eating light meals or foods, such as toast or cereal.  6 hours before the procedure - stop drinking milk or drinks that contain milk.  2 hours before the procedure - stop drinking clear liquids.  Medicines  Ask your health care provider about: ? Changing or stopping your regular medicines. This is  especially important if you are taking diabetes medicines or blood thinners. ? Taking medicines such as aspirin and ibuprofen. These medicines can thin your blood. Do not take these medicines before your procedure if your health care provider instructs you not to. ? Taking new dietary supplements or medicines. Do not take these during the week before your procedure unless your health care provider approves them.  If you are told to take a medicine or to continue taking a medicine on the day of the procedure, take the medicine with sips of water. General instructions   Ask if you will be going home the same day, the following day, or after a longer hospital stay. ? Plan to have someone take you home. ? Plan to have someone stay with you for the first 24 hours after you leave the hospital or clinic.  For 3-6 weeks before the procedure, try not to use any tobacco products, such as cigarettes, chewing tobacco, and e-cigarettes.  You may brush your teeth on the morning of the procedure, but make sure to spit out the toothpaste. What happens during the  procedure?  You will be given anesthetics through a mask and through an IV tube in one of your veins.  You may receive medicine to help you relax (sedative).  As soon as you are asleep, a breathing tube may be used to help you breathe.  An anesthesia specialist will stay with you throughout the procedure. He or she will help keep you comfortable and safe by continuing to give you medicines and adjusting the amount of medicine that you get. He or she will also watch your blood pressure, pulse, and oxygen levels to make sure that the anesthetics do not cause any problems.  If a breathing tube was used to help you breathe, it will be removed before you wake up. The procedure may vary among health care providers and hospitals. What happens after the procedure?  You will wake up, often slowly, after the procedure is complete, usually in a recovery  area.  Your blood pressure, heart rate, breathing rate, and blood oxygen level will be monitored until the medicines you were given have worn off.  You may be given medicine to help you calm down if you feel anxious or agitated.  If you will be going home the same day, your health care provider may check to make sure you can stand, drink, and urinate.  Your health care providers will treat your pain and side effects before you go home.  Do not drive for 24 hours if you received a sedative.  You may: ? Feel nauseous and vomit. ? Have a sore throat. ? Have mental slowness. ? Feel cold or shivery. ? Feel sleepy. ? Feel tired. ? Feel sore or achy, even in parts of your body where you did not have surgery. This information is not intended to replace advice given to you by your health care provider. Make sure you discuss any questions you have with your health care provider. Document Released: 06/10/2007 Document Revised: 08/14/2015 Document Reviewed: 02/15/2015 Elsevier Interactive Patient Education  2018 [place] Anesthesia, Adult, Care After These instructions provide you with information about caring for yourself after your procedure. Your health care provider may also give you more specific instructions. Your treatment has been planned according to current medical practices, but problems sometimes occur. Call your health care provider if you have any problems or questions after your procedure. What can I expect after the procedure? After the procedure, it is common to have:  Vomiting.  A sore throat.  Mental slowness.  It is common to feel:  Nauseous.  Cold or shivery.  Sleepy.  Tired.  Sore or achy, even in parts of your body where you did not have surgery.  Follow these instructions at home: For at least 24 hours after the procedure:  Do not: ? Participate in activities where you could fall or become injured. ? Drive. ? Use heavy machinery. ? Drink  alcohol. ? Take sleeping pills or medicines that cause drowsiness. ? Make important decisions or sign legal documents. ? Take care of children on your own.  Rest. Eating and drinking  If you vomit, drink water, juice, or soup when you can drink without vomiting.  Drink enough fluid to keep your urine clear or pale yellow.  Make sure you have little or no nausea before eating solid foods.  Follow the diet recommended by your health care provider. General instructions  Have a responsible adult stay with you until you are awake and alert.  Return to your normal activities as told  by your health care provider. Ask your health care provider what activities are safe for you.  Take over-the-counter and prescription medicines only as told by your health care provider.  If you smoke, do not smoke without supervision.  Keep all follow-up visits as told by your health care provider. This is important. Contact a health care provider if:  You continue to have nausea or vomiting at home, and medicines are not helpful.  You cannot drink fluids or start eating again.  You cannot urinate after 8-12 hours.  You develop a skin rash.  You have fever.  You have increasing redness at the site of your procedure. Get help right away if:  You have difficulty breathing.  You have chest pain.  You have unexpected bleeding.  You feel that you are having a life-threatening or urgent problem. This information is not intended to replace advice given to you by your health care provider. Make sure you discuss any questions you have with your health care provider. Document Released: 06/09/2000 Document Revised: 08/06/2015 Document Reviewed: 02/15/2015 Elsevier Interactive Patient Education  [employer].

## 2016-08-26 DIAGNOSIS — D510 Vitamin B12 deficiency anemia due to intrinsic factor deficiency: Secondary | ICD-10-CM | POA: Diagnosis not present

## 2016-08-28 ENCOUNTER — Encounter (HOSPITAL_COMMUNITY)
Admission: RE | Admit: 2016-08-28 | Discharge: 2016-08-28 | Disposition: A | Discharge status: Home / Self Care | Payer: Medicare Other | Source: Ambulatory Visit | Attending: Urology | Admitting: Urology

## 2016-08-28 ENCOUNTER — Encounter (HOSPITAL_COMMUNITY): Payer: Self-pay

## 2016-08-28 ENCOUNTER — Other Ambulatory Visit: Payer: Self-pay

## 2016-08-28 DIAGNOSIS — H409 Unspecified glaucoma: Secondary | ICD-10-CM | POA: Insufficient documentation

## 2016-08-28 DIAGNOSIS — Z87442 Personal history of urinary calculi: Secondary | ICD-10-CM | POA: Diagnosis not present

## 2016-08-28 DIAGNOSIS — K4090 Unilateral inguinal hernia, without obstruction or gangrene, not specified as recurrent: Secondary | ICD-10-CM | POA: Diagnosis not present

## 2016-08-28 DIAGNOSIS — Z01812 Encounter for preprocedural laboratory examination: Secondary | ICD-10-CM | POA: Insufficient documentation

## 2016-08-28 DIAGNOSIS — J45909 Unspecified asthma, uncomplicated: Secondary | ICD-10-CM | POA: Diagnosis not present

## 2016-08-28 DIAGNOSIS — Z888 Allergy status to other drugs, medicaments and biological substances status: Secondary | ICD-10-CM | POA: Insufficient documentation

## 2016-08-28 DIAGNOSIS — Z9889 Other specified postprocedural states: Secondary | ICD-10-CM | POA: Insufficient documentation

## 2016-08-28 DIAGNOSIS — Z79899 Other long term (current) drug therapy: Secondary | ICD-10-CM | POA: Insufficient documentation

## 2016-08-28 HISTORY — DX: Essential (primary) hypertension: I10

## 2016-08-28 HISTORY — DX: Personal history of urinary calculi: Z87.442

## 2016-08-28 LAB — CBC WITH DIFFERENTIAL/PLATELET
Basophils Absolute: 0 10*3/uL (ref 0.0–0.1)
Basophils Relative: 0 %
EOS ABS: 0.3 10*3/uL (ref 0.0–0.7)
Eosinophils Relative: 4 %
HCT: 41.6 % (ref 39.0–52.0)
HEMOGLOBIN: 14 g/dL (ref 13.0–17.0)
LYMPHS ABS: 2.5 10*3/uL (ref 0.7–4.0)
Lymphocytes Relative: 29 %
MCH: 30.9 pg (ref 26.0–34.0)
MCHC: 33.7 g/dL (ref 30.0–36.0)
MCV: 91.8 fL (ref 78.0–100.0)
Monocytes Absolute: 0.8 10*3/uL (ref 0.1–1.0)
Monocytes Relative: 10 %
NEUTROS PCT: 57 %
Neutro Abs: 4.9 10*3/uL (ref 1.7–7.7)
Platelets: 248 10*3/uL (ref 150–400)
RBC: 4.53 MIL/uL (ref 4.22–5.81)
RDW: 13.3 % (ref 11.5–15.5)
WBC: 8.5 10*3/uL (ref 4.0–10.5)

## 2016-08-28 LAB — BASIC METABOLIC PANEL
Anion gap: 10 (ref 5–15)
BUN: 23 mg/dL — ABNORMAL HIGH (ref 6–20)
CHLORIDE: 101 mmol/L (ref 101–111)
CO2: 26 mmol/L (ref 22–32)
CREATININE: 1.04 mg/dL (ref 0.61–1.24)
Calcium: 9 mg/dL (ref 8.9–10.3)
GFR calc non Af Amer: >60 mL/min (ref >60)
Glucose, Bld: 140 mg/dL — ABNORMAL HIGH (ref 65–99)
Potassium: 3.9 mmol/L (ref 3.5–5.1)
SODIUM: 137 mmol/L (ref 135–145)

## 2016-09-01 ENCOUNTER — Ambulatory Visit (HOSPITAL_COMMUNITY): Payer: Medicare Other | Admitting: Anesthesiology

## 2016-09-01 ENCOUNTER — Encounter (HOSPITAL_COMMUNITY): Payer: Self-pay

## 2016-09-01 ENCOUNTER — Ambulatory Visit (HOSPITAL_COMMUNITY)
Admission: RE | Admit: 2016-09-01 | Discharge: 2016-09-01 | Disposition: A | Discharge status: Home / Self Care | Payer: Medicare Other | Source: Ambulatory Visit | Attending: Urology | Admitting: Urology

## 2016-09-01 ENCOUNTER — Encounter (HOSPITAL_COMMUNITY)
Admission: RE | Disposition: A | Discharge status: Home / Self Care | Payer: Self-pay | Source: Ambulatory Visit | Attending: Urology

## 2016-09-01 DIAGNOSIS — I10 Essential (primary) hypertension: Secondary | ICD-10-CM | POA: Diagnosis not present

## 2016-09-01 DIAGNOSIS — J45909 Unspecified asthma, uncomplicated: Secondary | ICD-10-CM | POA: Insufficient documentation

## 2016-09-01 DIAGNOSIS — H409 Unspecified glaucoma: Secondary | ICD-10-CM | POA: Diagnosis not present

## 2016-09-01 DIAGNOSIS — Z91010 Allergy to peanuts: Secondary | ICD-10-CM | POA: Insufficient documentation

## 2016-09-01 DIAGNOSIS — Z87442 Personal history of urinary calculi: Secondary | ICD-10-CM | POA: Insufficient documentation

## 2016-09-01 DIAGNOSIS — K4091 Unilateral inguinal hernia, without obstruction or gangrene, recurrent: Secondary | ICD-10-CM | POA: Diagnosis not present

## 2016-09-01 DIAGNOSIS — N471 Phimosis: Secondary | ICD-10-CM | POA: Insufficient documentation

## 2016-09-01 DIAGNOSIS — Z79899 Other long term (current) drug therapy: Secondary | ICD-10-CM | POA: Insufficient documentation

## 2016-09-01 DIAGNOSIS — Z87891 Personal history of nicotine dependence: Secondary | ICD-10-CM | POA: Insufficient documentation

## 2016-09-01 DIAGNOSIS — N476 Balanoposthitis: Secondary | ICD-10-CM | POA: Diagnosis not present

## 2016-09-01 DIAGNOSIS — K4090 Unilateral inguinal hernia, without obstruction or gangrene, not specified as recurrent: Secondary | ICD-10-CM | POA: Diagnosis not present

## 2016-09-01 HISTORY — PX: CIRCUMCISION: SHX1350

## 2016-09-01 HISTORY — PX: INGUINAL HERNIA REPAIR: SHX194

## 2016-09-01 SURGERY — CIRCUMCISION, ADULT
Anesthesia: General

## 2016-09-01 MED ORDER — MIDAZOLAM HCL 2 MG/2ML IJ SOLN
INTRAMUSCULAR | Status: AC
  Filled 2016-09-01: qty 2

## 2016-09-01 MED ORDER — ONDANSETRON HCL 4 MG/2ML IJ SOLN
4.0000 mg | Freq: Once | INTRAMUSCULAR | Status: AC
  Administered 2016-09-01: 4 mg via INTRAVENOUS

## 2016-09-01 MED ORDER — ALBUTEROL SULFATE HFA 108 (90 BASE) MCG/ACT IN AERS
INHALATION_SPRAY | RESPIRATORY_TRACT | Status: DC | PRN
  Administered 2016-09-01: 2 via RESPIRATORY_TRACT

## 2016-09-01 MED ORDER — GLYCOPYRROLATE 0.2 MG/ML IJ SOLN
INTRAMUSCULAR | Status: AC
  Filled 2016-09-01: qty 2

## 2016-09-01 MED ORDER — ROCURONIUM BROMIDE 10 MG/ML (PF) SYRINGE
PREFILLED_SYRINGE | INTRAVENOUS | Status: DC | PRN
  Administered 2016-09-01: 40 mg via INTRAVENOUS

## 2016-09-01 MED ORDER — SODIUM CHLORIDE 0.9 % IR SOLN
Status: DC | PRN
  Administered 2016-09-01: 1000 mL

## 2016-09-01 MED ORDER — CEFAZOLIN SODIUM-DEXTROSE 1-4 GM/50ML-% IV SOLN: 1.0000 g | INTRAVENOUS | Status: DC

## 2016-09-01 MED ORDER — ROCURONIUM BROMIDE 50 MG/5ML IV SOLN
INTRAVENOUS | Status: AC
  Filled 2016-09-01: qty 1

## 2016-09-01 MED ORDER — BUPIVACAINE HCL (PF) 0.25 % IJ SOLN
INTRAMUSCULAR | Status: AC
  Filled 2016-09-01: qty 30

## 2016-09-01 MED ORDER — MIDAZOLAM HCL 2 MG/2ML IJ SOLN
1.0000 mg | INTRAMUSCULAR | Status: AC
Start: 2016-09-01 — End: 2016-09-01
  Administered 2016-09-01 (×2): 2 mg via INTRAVENOUS
  Filled 2016-09-01: qty 2

## 2016-09-01 MED ORDER — BUPIVACAINE HCL 0.25 % IJ SOLN
INTRAMUSCULAR | Status: DC | PRN
  Administered 2016-09-01: 10 mL

## 2016-09-01 MED ORDER — BUPIVACAINE LIPOSOME 1.3 % IJ SUSP
INTRAMUSCULAR | Status: DC | PRN
  Administered 2016-09-01: 20 mL

## 2016-09-01 MED ORDER — ALBUTEROL SULFATE HFA 108 (90 BASE) MCG/ACT IN AERS
INHALATION_SPRAY | RESPIRATORY_TRACT | Status: AC
  Filled 2016-09-01: qty 6.7

## 2016-09-01 MED ORDER — KETOROLAC TROMETHAMINE 30 MG/ML IJ SOLN
INTRAMUSCULAR | Status: AC
  Filled 2016-09-01: qty 1

## 2016-09-01 MED ORDER — LIDOCAINE HCL (PF) 1 % IJ SOLN
INTRAMUSCULAR | Status: AC
Start: 2016-09-01 — End: 2016-09-01
  Filled 2016-09-01: qty 5

## 2016-09-01 MED ORDER — FENTANYL CITRATE (PF) 100 MCG/2ML IJ SOLN
INTRAMUSCULAR | Status: AC
  Filled 2016-09-01: qty 2

## 2016-09-01 MED ORDER — OXYCODONE-ACETAMINOPHEN 5-325 MG PO TABS
1.0000 | ORAL_TABLET | Freq: Four times a day (QID) | ORAL | 0 refills | Status: DC | PRN
Start: 2016-09-01 — End: 2016-10-02

## 2016-09-01 MED ORDER — IPRATROPIUM-ALBUTEROL 0.5-2.5 (3) MG/3ML IN SOLN
RESPIRATORY_TRACT | Status: AC
Start: 2016-09-01 — End: 2016-09-01
  Filled 2016-09-01: qty 3

## 2016-09-01 MED ORDER — CHLORHEXIDINE GLUCONATE CLOTH 2 % EX PADS: 6.0000 | MEDICATED_PAD | Freq: Once | CUTANEOUS | Status: DC

## 2016-09-01 MED ORDER — BUPIVACAINE LIPOSOME 1.3 % IJ SUSP
INTRAMUSCULAR | Status: AC
  Filled 2016-09-01: qty 20

## 2016-09-01 MED ORDER — LIDOCAINE HCL (PF) 1 % IJ SOLN
INTRAMUSCULAR | Status: AC
Start: 2016-09-01 — End: 2016-09-01
  Filled 2016-09-01: qty 30

## 2016-09-01 MED ORDER — FENTANYL CITRATE (PF) 100 MCG/2ML IJ SOLN
INTRAMUSCULAR | Status: DC | PRN
  Administered 2016-09-01 (×2): 50 ug via INTRAVENOUS

## 2016-09-01 MED ORDER — LACTATED RINGERS IV SOLN
INTRAVENOUS | Status: DC
  Administered 2016-09-01: 09:00:00 via INTRAVENOUS

## 2016-09-01 MED ORDER — CEFAZOLIN SODIUM-DEXTROSE 2-4 GM/100ML-% IV SOLN
2.0000 g | INTRAVENOUS | Status: AC
  Administered 2016-09-01: 2 g via INTRAVENOUS

## 2016-09-01 MED ORDER — LIDOCAINE HCL (PF) 1 % IJ SOLN
INTRAMUSCULAR | Status: DC | PRN
  Administered 2016-09-01: 30 mg

## 2016-09-01 MED ORDER — CEFAZOLIN SODIUM-DEXTROSE 2-4 GM/100ML-% IV SOLN
INTRAVENOUS | Status: AC
  Filled 2016-09-01: qty 100

## 2016-09-01 MED ORDER — KETOROLAC TROMETHAMINE 30 MG/ML IJ SOLN
30.0000 mg | Freq: Once | INTRAMUSCULAR | Status: AC
  Administered 2016-09-01: 30 mg via INTRAVENOUS

## 2016-09-01 MED ORDER — IPRATROPIUM-ALBUTEROL 0.5-2.5 (3) MG/3ML IN SOLN
3.0000 mL | Freq: Once | RESPIRATORY_TRACT | Status: AC
  Administered 2016-09-01: 3 mL via RESPIRATORY_TRACT

## 2016-09-01 MED ORDER — NEOSTIGMINE METHYLSULFATE 10 MG/10ML IV SOLN
INTRAVENOUS | Status: AC
  Filled 2016-09-01: qty 1

## 2016-09-01 MED ORDER — GLYCOPYRROLATE 0.2 MG/ML IJ SOLN
INTRAMUSCULAR | Status: DC | PRN
  Administered 2016-09-01: 0.4 mg via INTRAVENOUS

## 2016-09-01 MED ORDER — PROPOFOL 10 MG/ML IV BOLUS
INTRAVENOUS | Status: DC | PRN
  Administered 2016-09-01: 120 mg via INTRAVENOUS

## 2016-09-01 MED ORDER — CHLORHEXIDINE GLUCONATE CLOTH 2 % EX PADS
6.0000 | MEDICATED_PAD | Freq: Once | CUTANEOUS | Status: DC
Start: 2016-09-01 — End: 2016-09-01

## 2016-09-01 MED ORDER — PROPOFOL 10 MG/ML IV BOLUS
INTRAVENOUS | Status: AC
  Filled 2016-09-01: qty 20

## 2016-09-01 MED ORDER — FENTANYL CITRATE (PF) 100 MCG/2ML IJ SOLN
25.0000 ug | INTRAMUSCULAR | Status: DC | PRN
  Administered 2016-09-01: 50 ug via INTRAVENOUS

## 2016-09-01 MED ORDER — ONDANSETRON HCL 4 MG/2ML IJ SOLN
INTRAMUSCULAR | Status: AC
  Filled 2016-09-01: qty 2

## 2016-09-01 MED ORDER — NEOSTIGMINE METHYLSULFATE 10 MG/10ML IV SOLN
INTRAVENOUS | Status: DC | PRN
  Administered 2016-09-01: 3 mg via INTRAVENOUS

## 2016-09-01 SURGICAL SUPPLY — 45 items
BAG HAMPER (MISCELLANEOUS) ×6 IMPLANT
BANDAGE COBAN STERILE 2 (GAUZE/BANDAGES/DRESSINGS) ×3 IMPLANT
BNDG CONFORM 2 STRL LF (GAUZE/BANDAGES/DRESSINGS) ×3 IMPLANT
CLOTH BEACON ORANGE TIMEOUT ST (SAFETY) ×3 IMPLANT
COVER LIGHT HANDLE STERIS (MISCELLANEOUS) ×6 IMPLANT
COVER SURGICAL LIGHT HANDLE (MISCELLANEOUS) ×6 IMPLANT
DECANTER SPIKE VIAL GLASS SM (MISCELLANEOUS) ×3 IMPLANT
DERMABOND ADVANCED (GAUZE/BANDAGES/DRESSINGS) ×2
DERMABOND ADVANCED .7 DNX12 (GAUZE/BANDAGES/DRESSINGS) ×1 IMPLANT
DRAIN PENROSE 18X1/2 LTX STRL (DRAIN) ×3 IMPLANT
DRSG XEROFORM 1X8 (GAUZE/BANDAGES/DRESSINGS) ×3 IMPLANT
ELECT NEEDLE TIP 2.8 STRL (NEEDLE) ×3 IMPLANT
ELECT REM PT RETURN 9FT ADLT (ELECTROSURGICAL) ×6
ELECTRODE REM PT RTRN 9FT ADLT (ELECTROSURGICAL) ×2 IMPLANT
GLOVE BIO SURGEON STRL SZ8 (GLOVE) ×3 IMPLANT
GLOVE BIOGEL PI IND STRL 7.0 (GLOVE) ×2 IMPLANT
GLOVE BIOGEL PI INDICATOR 7.0 (GLOVE) ×4
GLOVE SURG SS PI 7.5 STRL IVOR (GLOVE) ×3 IMPLANT
GOWN STRL REUS W/ TWL XL LVL3 (GOWN DISPOSABLE) ×2 IMPLANT
GOWN STRL REUS W/TWL LRG LVL3 (GOWN DISPOSABLE) ×6 IMPLANT
GOWN STRL REUS W/TWL XL LVL3 (GOWN DISPOSABLE) ×4
INST SET MINOR GENERAL (KITS) ×3 IMPLANT
KIT ROOM TURNOVER APOR (KITS) ×9 IMPLANT
MANIFOLD NEPTUNE II (INSTRUMENTS) ×3 IMPLANT
MESH HERNIA 3X6 (Mesh General) ×3 IMPLANT
NEEDLE HYPO 21X1.5 SAFETY (NEEDLE) ×3 IMPLANT
NEEDLE HYPO 25X1 1.5 SAFETY (NEEDLE) IMPLANT
NS IRRIG 1000ML POUR BTL (IV SOLUTION) ×3 IMPLANT
NS IRRIG 500ML POUR BTL (IV SOLUTION) IMPLANT
PACK MINOR (CUSTOM PROCEDURE TRAY) ×6 IMPLANT
PAD ARMBOARD 7.5X6 YLW CONV (MISCELLANEOUS) ×6 IMPLANT
SET BASIN LINEN APH (SET/KITS/TRAYS/PACK) ×6 IMPLANT
SUT NOVA NAB GS-22 2 2-0 T-19 (SUTURE) IMPLANT
SUT PROLENE 2 0 SH 30 (SUTURE) IMPLANT
SUT SILK 3 0 (SUTURE)
SUT SILK 3-0 18XBRD TIE 12 (SUTURE) IMPLANT
SUT VIC AB 2-0 CT1 27 (SUTURE) ×2
SUT VIC AB 2-0 CT1 TAPERPNT 27 (SUTURE) ×1 IMPLANT
SUT VIC AB 3-0 SH 27 (SUTURE) ×2
SUT VIC AB 3-0 SH 27X BRD (SUTURE) ×1 IMPLANT
SUT VIC AB 4-0 PS2 27 (SUTURE) ×9 IMPLANT
SUT VICRYL AB 3 0 TIES (SUTURE) IMPLANT
SYR 20CC LL (SYRINGE) ×3 IMPLANT
SYR CONTROL 10ML LL (SYRINGE) ×3 IMPLANT
TOWEL OR 17X26 10 PK STRL BLUE (TOWEL DISPOSABLE) ×3 IMPLANT

## 2016-09-01 NOTE — Interval H&P Note (Signed)
History and Physical Interval Note:  09/01/2016 9:12 AM  Mark Harris  has presented today for surgery, with the diagnosis of PHIMOSIS, recurrent right inguinal hernia  The various methods of treatment have been discussed with the patient and family. After consideration of risks, benefits and other options for treatment, the patient has consented to  Procedure(s) with comments: CIRCUMCISION ADULT (N/A) - 2 HRS TOTAL  - CORDINATED CASE W/ DR Arnoldo Morale WHO WILL GO 2ND MCKENZIE NEEDS 60 MIN FOR CIRC [phone] MEDICARE-[id number] A BCBS-[id number] RECURRENT HERNIA REPAIR INGUINAL ADULT WITH MESH (N/A) as a surgical intervention .  The patient's history has been reviewed, patient examined, no change in status, stable for surgery.  I have reviewed the patient's chart and labs.  Questions were answered to the patient's satisfaction.     Aviva Signs

## 2016-09-01 NOTE — Progress Notes (Signed)
Awake. resp adequate/nonlabored. BBS clear on auscultation but diminished in bases. States feels resp are tight and would like a breathing tx. Dr Patsey Berthold notified. Order given.

## 2016-09-01 NOTE — Op Note (Signed)
Patient:  Mark Harris  DOB:  [birth year]  MRN:  [id number]   Preop Diagnosis:  Recurrent right inguinal hernia  Postop Diagnosis:  Same  Procedure:  Recurrent right inguinal herniorrhaphy with mesh  Surgeon:  Aviva Signs, M.D.  Anes:  Gen. endotracheal  Indications:  Patient is a 76 year old white male status post right angle herniorrhaphy in the remote past who presents with pain over the internal ring with a small hernia present. The risks and benefits of the procedure including bleeding, infection, pain, and the possibility of recurrence of the hernia were fully explained to the patient, who gave informed consent.  Procedure note:  The patient was already intubated, having undergone a circumcision by urology. The right inguinal region was prepped and draped using usual sterile technique with Techni-Care. Surgical site confirmation was performed.  A right groin incision was made through a previous surgical scar. This was taken down to the external oblique aponeuroses. The aponeuroses were was excised to the external ring. The ilioinguinal nerve was identified and separated from some underlining mesh visit oriented and placed in the past. On inspection, the patient had a small indirect hernia which contained properitoneal fat tissue. A Penrose drain was placed around the spermatic cord. The vase deference was no of the spermatic cord. The lipomatous tissue was high at the abdominal wall and remnant tissue inverted. A piece of Marlex was then fashioned in a cylinder like fashion in order to plug the hernia. There was secured in place using a 2-0 Novafil interrupted suture. The external oblique aponeuroses was reapproximated using a 2-0 Vicryl running suture. The subcutaneous layer was reapproximated using a 3-0 Vicryl interrupted suture. The skin was closed using a 4-0 Vicryl subcuticular suture. Exparel was instilled into the surrounding wound. Dermabond was then applied.  All tape and  needle counts were correct at the end of the procedure. The patient was extubated in the operating room and transferred to PACU in stable condition.  Complications:  None  EBL:  Minimal  Specimen:  None

## 2016-09-01 NOTE — Transfer of Care (Signed)
Immediate Anesthesia Transfer of Care Note  Patient: Mark Harris  Procedure(s) Performed: Procedure(s) with comments: CIRCUMCISION ADULT (N/A) - 2 HRS TOTAL  - CORDINATED CASE W/ DR Arnoldo Morale WHO WILL GO 2ND MCKENZIE NEEDS 60 MIN FOR CIRC [phone] MEDICARE-[id number] A BCBS-[id number] RECURRENT HERNIA REPAIR INGUINAL ADULT WITH MESH (N/A)  Patient Location: PACU  Anesthesia Type:General  Level of Consciousness: awake, oriented, drowsy and patient cooperative  Airway & Oxygen Therapy: Patient Spontanous Breathing and Patient connected to nasal cannula oxygen  Post-op Assessment: Report given to RN, Post -op Vital signs reviewed and stable and Patient moving all extremities  Post vital signs: Reviewed and stable  Last Vitals:  Vitals:   09/01/16 1010 09/01/16 1015  BP: (!) 141/67 131/73  Pulse:    Resp: 14 (!) 22  Temp:      Last Pain:  Vitals:   09/01/16 0747  TempSrc: Oral         Complications: No apparent anesthesia complications

## 2016-09-01 NOTE — Discharge Instructions (Signed)
Open Hernia Repair, Adult, Care After This sheet gives you information about how to care for yourself after your procedure. Your health care provider may also give you more specific instructions. If you have problems or questions, contact your health care provider. What can I expect after the procedure? After the procedure, it is common to have:  Mild discomfort.  Slight bruising.  Minor swelling.  Pain in the abdomen.  Follow these instructions at home: Incision care   Follow instructions from your health care provider about how to take care of your incision area. Make sure you: ? Wash your hands with soap and water before you change your bandage (dressing). If soap and water are not available, use hand sanitizer. ? Change your dressing as told by your health care provider. ? Leave stitches (sutures), skin glue, or adhesive strips in place. These skin closures may need to stay in place for 2 weeks or longer. If adhesive strip edges start to loosen and curl up, you may trim the loose edges. Do not remove adhesive strips completely unless your health care provider tells you to do that.  Check your incision area every day for signs of infection. Check for: ? More redness, swelling, or pain. ? More fluid or blood. ? Warmth. ? Pus or a bad smell. Activity  Do not drive or use heavy machinery while taking prescription pain medicine. Do not drive until your health care provider approves.  Until your health care provider approves: ? Do not lift anything that is heavier than 10 lb (4.5 kg). ? Do not play contact sports.  Return to your normal activities as told by your health care provider. Ask your health care provider what activities are safe. General instructions  To prevent or treat constipation while you are taking prescription pain medicine, your health care provider may recommend that you: ? Drink enough fluid to keep your urine clear or pale yellow. ? Take over-the-counter or  prescription medicines. ? Eat foods that are high in fiber, such as fresh fruits and vegetables, whole grains, and beans. ? Limit foods that are high in fat and processed sugars, such as fried and sweet foods.  Take over-the-counter and prescription medicines only as told by your health care provider.  Do not take tub baths or go swimming until your health care provider approves.  Keep all follow-up visits as told by your health care provider. This is important. Contact a health care provider if:  You develop a rash.  You have more redness, swelling, or pain around your incision.  You have more fluid or blood coming from your incision.  Your incision feels warm to the touch.  You have pus or a bad smell coming from your incision.  You have a fever or chills.  You have blood in your stool (feces).  You have not had a bowel movement in 2-3 days.  Your pain is not controlled with medicine. Get help right away if:  You have chest pain or shortness of breath.  You feel light-headed or feel faint.  You have severe pain.  You vomit and your pain is worse. This information is not intended to replace advice given to you by your health care provider. Make sure you discuss any questions you have with your health care provider. Document Released: 09/20/2004 Document Revised: 09/21/2015 Document Reviewed: 08/15/2015 Elsevier Interactive Patient Education  2017 [employer]. Circumcision, Adult, Care After This sheet gives you information about how to care for yourself after your  procedure. Your doctor may also give you more specific instructions. If you have problems or questions, contact your doctor. Follow these instructions at home: Cut care  Follow instructions from your doctor about how to take care of your cut from surgery (incision). Make sure you: ? Wash your hands with soap and water before you change your bandage (dressing). If you cannot use soap and water, use hand  sanitizer. ? Change your bandage as told by your doctor. ? Leave stitches (sutures), skin glue, or skin tape (adhesive) strips in place. They may need to stay in place for 2 weeks or longer. If tape strips get loose and curl up, you may trim the loose edges. Do not remove tape strips completely unless your doctor says it is okay  Check your cut area every day for signs of infection. Check for: ? More redness, swelling, or pain. ? More fluid or blood. ? Warmth. ? Pus or a bad smell. Bathing  Do not take baths, swim, or use a hot tub until your doctor says it is okay. Ask your doctor if you can take showers. You may only be allowed to take sponge baths for bathing.  Do not get your cut area wet during the 24 hours after the procedure, or as long as told by your doctor.  When you can shower, do not rub the cut area. Gently pat it dry. General instructions  Avoid heavy lifting, contact sports, biking, or swimming until your doctor says it is okay.  Do not have sex until your doctor says it is okay.  Take or apply over-the-counter and prescription medicines only as told by your doctor.  Keep all follow-up visits as told by your doctor. This is important. Contact a doctor if:  Medicine does not help your pain.  You have more redness, swelling, or pain around your cut.  You have more fluid or blood coming from your cut.  Your cut feels warm to the touch.  You have pus or a bad smell coming from your cut.  You have a fever. Get help right away if:  You cannot pee (urinate).  It hurts to pee.  Your pain is not helped by medicines.  There is redness, swelling, and soreness that spreads up the shaft of your penis, your thighs, or your lower belly (abdomen).  You have bleeding that does not stop when you press on it. Summary  Follow instructions from your doctor about how to take care of your cut from surgery (incision).  Check your cut area every day for signs of  infection.  Do not have sex until your doctor says it is okay. This information is not intended to replace advice given to you by your health care provider. Make sure you discuss any questions you have with your health care provider. Document Released: 08/20/2007 Document Revised: 03/11/2016 Document Reviewed: 03/11/2016 Elsevier Interactive Patient Education  2017 [employer].

## 2016-09-01 NOTE — Anesthesia Postprocedure Evaluation (Signed)
Anesthesia Post Note Late Entry 1245  Patient: Mark Harris  Procedure(s) Performed: Procedure(s) (LRB): CIRCUMCISION ADULT (N/A) RECURRENT HERNIA REPAIR INGUINAL ADULT WITH MESH (N/A)  Patient location during evaluation: PACU Anesthesia Type: General Level of consciousness: awake and alert, oriented and patient cooperative Pain management: pain level controlled Vital Signs Assessment: post-procedure vital signs reviewed and stable Respiratory status: spontaneous breathing Cardiovascular status: stable Anesthetic complications: no     Last Vitals:  Vitals:   09/01/16 1315 09/01/16 1320  BP: (!) (P) 162/74 (!) 153/67  Pulse: 64 (!) 58  Resp: 13 14  Temp:  36.7 C    Last Pain:  Vitals:   09/01/16 1320  TempSrc: Oral  PainSc: 1                  Texie Tupou A

## 2016-09-01 NOTE — Progress Notes (Signed)
Duo neb breathing tx done. Tolerated well. Resp adequate/nonlabored.

## 2016-09-01 NOTE — Anesthesia Procedure Notes (Signed)
Procedure Name: Intubation Date/Time: 09/01/2016 10:29 AM Performed by: Lajuana Carry E Pre-anesthesia Checklist: Patient identified, Emergency Drugs available, Suction available and Patient being monitored Patient Re-evaluated:Patient Re-evaluated prior to inductionOxygen Delivery Method: Circle system utilized Preoxygenation: Pre-oxygenation with 100% oxygen Intubation Type: IV induction Ventilation: Mask ventilation without difficulty and Two handed mask ventilation required Laryngoscope Size: Glidescope and 4 Grade View: Grade I Tube type: Oral Tube size: 7.0 mm Number of attempts: 2 Airway Equipment and Method: Stylet and Oral airway Placement Confirmation: ETT inserted through vocal cords under direct vision,  positive ETCO2 and breath sounds checked- equal and bilateral Secured at: 22 cm Tube secured with: Tape Dental Injury: Teeth and Oropharynx as per pre-operative assessment  Difficulty Due To: Difficult Airway- due to anterior larynx Comments: Initial attempt with minimal view, slightly anterior a/w and redundant tissue noted. Easy intubation with glidescope. O2 sats upper 90's throughout, adequate mask ventilation. Teeth/lips/gums as pre op.

## 2016-09-01 NOTE — Addendum Note (Signed)
Addendum  created 09/01/16 1346 by Ollen Bowl, CRNA   Charge Capture section accepted

## 2016-09-01 NOTE — Op Note (Signed)
Preoperative diagnosis: phimosis  Postoperative diagnosis: Same  Procedure: circumcision  Attending: Nicolette Bang, MD  Anesthesia: general  History of blood loss: Minimal  Antibiotics: ancef  Drains: none  Specimens: foreskin  Findings: dense phimotic ring. No masses/lesions on glans or foreskin.  Indications: Patient is a 76 year old male with a history of phimosis, difficulty urinating, and recurrent balanoprothitis  After discussing treatment options he decided to proceed with circumcision   Procedure in detail: Prior to procedure consent was obtained.  Patient was brought to the operating room and a brief timeout was done to ensure correct patient, correct procedure, correct site.  General anesthesia was administered and patient was placed in supine position.  His genitalia and abdomen was then prepped and draped in usual sterile fashion.  The phimotic ring was incised sharply and the foreskin was then retracted. An circumferential incision was made 1.5cm below the coronal ridge. We then pulled the foreskin over the glans and made a separate circumferential incision at the level of the coronal ridge. We then sharply incised the foreskin and then freed it from the dartos using electrocautery. We then sent the foreskin for pathology. Individual bleeders were then cauterized and good hemostasis was noted. We then reapproximated the penile skin to the subcoronal incision. We used interrupted 3-0 vicryl to close the incision. Once this was complete we applied a gauze bandage and this then concluded the procedure which was well tolerated by the patient.  Complications: None  Condition: Stable, extubated, transferred to PACU.  Plan: Patient is to be discharged home.  He is to followup in 2 weeks for a wound check

## 2016-09-01 NOTE — H&P (Signed)
Urology Admission H&P  Chief Complaint: phimosis  History of Present Illness: Mark Harris is a 76yo with a hx of phimosis and difficulty urinating due to phimosis  Past Medical History:  Diagnosis Date  . Anemia   . Asthma   . Glaucoma    right eye  . History of kidney stones   . Hypertension    Past Surgical History:  Procedure Laterality Date  . HERNIA REPAIR     umbilical  . INGUINAL HERNIA REPAIR Right 06/25/2012   Procedure: HERNIA REPAIR INGUINAL ADULT;  Surgeon: Donato Heinz, MD;  Location: [facility];  Service: General;  Laterality: Right;  . INSERTION OF MESH Right 06/25/2012   Procedure: INSERTION OF MESH;  Surgeon: Donato Heinz, MD;  Location: [facility];  Service: General;  Laterality: Right;    Home Medications:  Prescriptions Prior to Admission  Medication Sig Dispense Refill Last Dose  . albuterol (PROVENTIL HFA;VENTOLIN HFA) 108 (90 BASE) MCG/ACT inhaler Inhale 2 puffs into the lungs every 6 (six) hours as needed (for asthma).    09/01/2016 at Unknown time  . bimatoprost (LUMIGAN) 0.01 % SOLN Place 1 drop into both eyes at bedtime.   08/31/2016 at Unknown time  . Brinzolamide-Brimonidine (SIMBRINZA) 1-0.2 % SUSP Place 1 drop into the right eye 3 (three) times daily.   08/31/2016 at Unknown time  . olmesartan-hydrochlorothiazide (BENICAR HCT) 40-25 MG tablet Take 0.5 tablets by mouth daily.   08/31/2016 at Unknown time  . Potassium 99 MG TABS Take 99 mg by mouth daily.   08/31/2016 at Unknown time  . SYMBICORT 80-4.5 MCG/ACT inhaler Inhale 2 puffs into the lungs daily as needed. For breathing/respiratory issues (typically twice a week)   09/01/2016 at Unknown time   Allergies:  Allergies  Allergen Reactions  . Peanuts [Peanut Oil] Other (See Comments)    Patient states his throat closed up, also happened with some Trail Mix (pt states he is no longer allergic)    History reviewed. No pertinent family history. Social History:  reports that he has never smoked. He has  quit using smokeless tobacco. He reports that he drinks alcohol. He reports that he does not use drugs.  Review of Systems  All other systems reviewed and are negative.   Physical Exam:  Vital signs in last 24 hours:   Physical Exam  Constitutional: He is oriented to person, place, and time. He appears well-developed and well-nourished.  HENT:  Head: Normocephalic and atraumatic.  Eyes: EOM are normal. Pupils are equal, round, and reactive to light.  Neck: Normal range of motion. No thyromegaly present.  Cardiovascular: Normal rate and regular rhythm.   Respiratory: Effort normal. No respiratory distress.  GI: Soft. He exhibits no distension.  Musculoskeletal: Normal range of motion. He exhibits no edema.  Neurological: He is alert and oriented to person, place, and time.  Skin: Skin is warm and dry.  Psychiatric: He has a normal mood and affect. His behavior is normal. Judgment and thought content normal.    Laboratory Data:  No results found for this or any previous visit (from the past 24 hour(s)). No results found for this or any previous visit (from the past 240 hour(s)). Creatinine:  Recent Labs  08/28/16 1429  CREATININE 1.04   Baseline Creatinine: 1  Impression/Assessment:  75yo with phimosis  Plan:  The risks/benefits/alternatives to circumcision was explaiend to the patienbt and he understands and wishes to proceed with surgery  Mark Harris 09/01/2016, 7:44 AM

## 2016-09-01 NOTE — Anesthesia Preprocedure Evaluation (Signed)
Anesthesia Evaluation  Patient identified by MRN, date of birth, ID band Patient awake    Reviewed: Allergy & Precautions, H&P , NPO status , Patient's Chart, lab work & pertinent test results  Airway Mallampati: I  TM Distance: >3 FB     Dental  (+) Partial Upper, Partial Lower   Pulmonary asthma ,    breath sounds clear to auscultation       Cardiovascular hypertension, Pt. on medications negative cardio ROS   Rhythm:Regular Rate:Normal     Neuro/Psych    GI/Hepatic negative GI ROS,   Endo/Other    Renal/GU      Musculoskeletal   Abdominal   Peds  Hematology   Anesthesia Other Findings   Reproductive/Obstetrics                             Anesthesia Physical Anesthesia Plan  ASA: II  Anesthesia Plan: General   Post-op Pain Management:    Induction: Intravenous  PONV Risk Score and Plan:   Airway Management Planned: Oral ETT  Additional Equipment:   Intra-op Plan:   Post-operative Plan: Extubation in OR  Informed Consent: I have reviewed the patients History and Physical, chart, labs and discussed the procedure including the risks, benefits and alternatives for the proposed anesthesia with the patient or authorized representative who has indicated his/her understanding and acceptance.     Plan Discussed with:   Anesthesia Plan Comments:         Anesthesia Quick Evaluation

## 2016-09-02 ENCOUNTER — Encounter (HOSPITAL_COMMUNITY): Payer: Self-pay | Admitting: Urology

## 2016-09-03 ENCOUNTER — Encounter (INDEPENDENT_AMBULATORY_CARE_PROVIDER_SITE_OTHER): Payer: Self-pay | Admitting: Internal Medicine

## 2016-09-10 ENCOUNTER — Ambulatory Visit (INDEPENDENT_AMBULATORY_CARE_PROVIDER_SITE_OTHER): Payer: Self-pay | Admitting: Urology

## 2016-09-10 DIAGNOSIS — N471 Phimosis: Secondary | ICD-10-CM

## 2016-09-11 ENCOUNTER — Ambulatory Visit (INDEPENDENT_AMBULATORY_CARE_PROVIDER_SITE_OTHER): Payer: Self-pay | Admitting: General Surgery

## 2016-09-11 ENCOUNTER — Encounter: Payer: Self-pay | Admitting: General Surgery

## 2016-09-11 VITALS — BP 163/67 | HR 70 | Temp 97.8°F | Resp 18 | Ht 66.0 in | Wt 195.0 lb

## 2016-09-11 DIAGNOSIS — Z09 Encounter for follow-up examination after completed treatment for conditions other than malignant neoplasm: Secondary | ICD-10-CM

## 2016-09-11 NOTE — Progress Notes (Signed)
Subjective:     Mark Harris  Status post recurrent right inguinal herniorrhaphy with mesh. Doing well. Has no pain. Objective:    BP (!) 163/67   Pulse 70   Temp 97.8 F (36.6 C)   Resp 18   Ht 5\' 6"  (1.676 m)   Wt 195 lb (88.5 kg)   BMI 31.47 kg/m   General:  alert, cooperative and no distress  Right inguinal incision healing well. No ecchymosis noted.     Assessment:    Doing well postoperatively.    Plan:   Increase activity as able. Follow-up as needed.

## 2016-09-16 ENCOUNTER — Ambulatory Visit (INDEPENDENT_AMBULATORY_CARE_PROVIDER_SITE_OTHER): Payer: Medicare Other | Admitting: Internal Medicine

## 2016-09-16 ENCOUNTER — Telehealth (INDEPENDENT_AMBULATORY_CARE_PROVIDER_SITE_OTHER): Payer: Self-pay | Admitting: *Deleted

## 2016-09-16 ENCOUNTER — Encounter (INDEPENDENT_AMBULATORY_CARE_PROVIDER_SITE_OTHER): Payer: Self-pay | Admitting: Internal Medicine

## 2016-09-16 ENCOUNTER — Encounter (INDEPENDENT_AMBULATORY_CARE_PROVIDER_SITE_OTHER): Payer: Self-pay | Admitting: *Deleted

## 2016-09-16 ENCOUNTER — Other Ambulatory Visit (INDEPENDENT_AMBULATORY_CARE_PROVIDER_SITE_OTHER): Payer: Self-pay | Admitting: Internal Medicine

## 2016-09-16 VITALS — BP 146/68 | HR 68 | Temp 97.6°F | Ht 66.0 in | Wt 192.1 lb

## 2016-09-16 DIAGNOSIS — R195 Other fecal abnormalities: Secondary | ICD-10-CM | POA: Insufficient documentation

## 2016-09-16 NOTE — Telephone Encounter (Signed)
Patient needs trilyte -- guaiac pos stool

## 2016-09-16 NOTE — Progress Notes (Signed)
   Subjective:    Patient ID: Mark Harris, male    DOB: September 26, 1940, 76 y.o.   MRN: [id number]  HPI Referred by Dr Hilma Favors for positive stool card. Denies seeing any blood in his stoo. No weight loss. Appetite is good. BM x 1 a day. No GI complaints. Has never undergone a colonoscopy in the past. Recent rt inguinal hernia repair in June of this year  Review of Systems Past Medical History:  Diagnosis Date  . Anemia   . Asthma   . Glaucoma    right eye  . History of kidney stones   . Hypertension     Past Surgical History:  Procedure Laterality Date  . CIRCUMCISION N/A 09/01/2016   Procedure: CIRCUMCISION ADULT;  Surgeon: Cleon Gustin, MD;  Location: [facility];  Service: Urology;  Laterality: N/A;  2 HRS TOTAL  - CORDINATED CASE W/ DR Arnoldo Morale WHO WILL GO 2ND MCKENZIE NEEDS 60 MIN FOR CIRC [phone] MEDICARE-[id number] A BCBS-[id number]  . HERNIA REPAIR     umbilical  . INGUINAL HERNIA REPAIR Right 06/25/2012   Procedure: HERNIA REPAIR INGUINAL ADULT;  Surgeon: Donato Heinz, MD;  Location: [facility];  Service: General;  Laterality: Right;  . INGUINAL HERNIA REPAIR N/A 09/01/2016   Procedure: RECURRENT HERNIA REPAIR INGUINAL ADULT WITH MESH;  Surgeon: Aviva Signs, MD;  Location: [facility];  Service: General;  Laterality: N/A;  . INSERTION OF MESH Right 06/25/2012   Procedure: INSERTION OF MESH;  Surgeon: Donato Heinz, MD;  Location: [facility];  Service: General;  Laterality: Right;    No Known Allergies  Current Outpatient Prescriptions on File Prior to Visit  Medication Sig Dispense Refill  . albuterol (PROVENTIL HFA;VENTOLIN HFA) 108 (90 BASE) MCG/ACT inhaler Inhale 2 puffs into the lungs every 6 (six) hours as needed (for asthma).     . bimatoprost (LUMIGAN) 0.01 % SOLN Place 1 drop into both eyes at bedtime.    Marland Kitchen olmesartan-hydrochlorothiazide (BENICAR HCT) 40-25 MG tablet Take 0.5 tablets by mouth daily.    . SYMBICORT 80-4.5 MCG/ACT inhaler Inhale 2 puffs  into the lungs daily as needed. For breathing/respiratory issues (typically twice a week)    . Brinzolamide-Brimonidine (SIMBRINZA) 1-0.2 % SUSP Place 1 drop into the right eye 3 (three) times daily.    Marland Kitchen oxyCODONE-acetaminophen (ROXICET) 5-325 MG tablet Take 1 tablet by mouth every 6 (six) hours as needed. 30 tablet 0  . Potassium 99 MG TABS Take 99 mg by mouth daily.     No current facility-administered medications on file prior to visit.         Objective:   Physical Exam Blood pressure (!) 146/68, pulse 68, temperature 97.6 F (36.4 C), height 5\' 6"  (1.676 m), weight 192 lb 1.6 oz (87.1 kg).  Alert and oriented. Skin warm and dry. Oral mucosa is moist.   . Sclera anicteric, conjunctivae is pink. Thyroid not enlarged. No cervical lymphadenopathy. Lungs clear. Heart regular rate and rhythm.  Abdomen is soft. Bowel sounds are positive. No hepatomegaly. No abdominal masses felt. No tenderness. Rt lower scar from recent hernia repair.  No edema to lower extremities.          Assessment & Plan:  Guaiac positive stool. Colonic neoplasm needs to be ruled out. Colonoscopy. The risks of bleeding, perforation and infection were reviewed with patient.

## 2016-09-16 NOTE — Patient Instructions (Signed)
The risks of bleeding, perforation and infection were reviewed with patient.  

## 2016-09-18 MED ORDER — PEG 3350-KCL-NA BICARB-NACL 420 G PO SOLR: 4000.0000 mL | Freq: Once | ORAL | 0 refills | Status: AC

## 2016-09-22 ENCOUNTER — Encounter (INDEPENDENT_AMBULATORY_CARE_PROVIDER_SITE_OTHER): Payer: Self-pay

## 2016-10-02 ENCOUNTER — Encounter (HOSPITAL_COMMUNITY): Payer: Self-pay | Admitting: *Deleted

## 2016-10-02 ENCOUNTER — Encounter (HOSPITAL_COMMUNITY)
Admission: RE | Disposition: A | Discharge status: Home / Self Care | Payer: Self-pay | Source: Ambulatory Visit | Attending: Internal Medicine

## 2016-10-02 ENCOUNTER — Ambulatory Visit (HOSPITAL_COMMUNITY)
Admission: RE | Admit: 2016-10-02 | Discharge: 2016-10-02 | Disposition: A | Discharge status: Home / Self Care | Payer: Medicare Other | Source: Ambulatory Visit | Attending: Internal Medicine | Admitting: Internal Medicine

## 2016-10-02 DIAGNOSIS — D120 Benign neoplasm of cecum: Secondary | ICD-10-CM | POA: Diagnosis not present

## 2016-10-02 DIAGNOSIS — K644 Residual hemorrhoidal skin tags: Secondary | ICD-10-CM | POA: Insufficient documentation

## 2016-10-02 DIAGNOSIS — K5730 Diverticulosis of large intestine without perforation or abscess without bleeding: Secondary | ICD-10-CM | POA: Insufficient documentation

## 2016-10-02 DIAGNOSIS — I10 Essential (primary) hypertension: Secondary | ICD-10-CM | POA: Insufficient documentation

## 2016-10-02 DIAGNOSIS — H409 Unspecified glaucoma: Secondary | ICD-10-CM | POA: Insufficient documentation

## 2016-10-02 DIAGNOSIS — Z79899 Other long term (current) drug therapy: Secondary | ICD-10-CM | POA: Diagnosis not present

## 2016-10-02 DIAGNOSIS — R195 Other fecal abnormalities: Secondary | ICD-10-CM | POA: Insufficient documentation

## 2016-10-02 DIAGNOSIS — Z87442 Personal history of urinary calculi: Secondary | ICD-10-CM | POA: Diagnosis not present

## 2016-10-02 DIAGNOSIS — J45909 Unspecified asthma, uncomplicated: Secondary | ICD-10-CM | POA: Insufficient documentation

## 2016-10-02 DIAGNOSIS — Z87891 Personal history of nicotine dependence: Secondary | ICD-10-CM | POA: Insufficient documentation

## 2016-10-02 HISTORY — PX: COLONOSCOPY: SHX5424

## 2016-10-02 HISTORY — DX: Dyspnea, unspecified: R06.00

## 2016-10-02 HISTORY — PX: POLYPECTOMY: SHX5525

## 2016-10-02 SURGERY — COLONOSCOPY
Anesthesia: Moderate Sedation

## 2016-10-02 MED ORDER — STERILE WATER FOR IRRIGATION IR SOLN
Status: DC | PRN
  Administered 2016-10-02: 08:00:00

## 2016-10-02 MED ORDER — MIDAZOLAM HCL 5 MG/5ML IJ SOLN
INTRAMUSCULAR | Status: AC
  Filled 2016-10-02: qty 10

## 2016-10-02 MED ORDER — MEPERIDINE HCL 50 MG/ML IJ SOLN
INTRAMUSCULAR | Status: DC | PRN
  Administered 2016-10-02 (×2): 25 mg via INTRAVENOUS

## 2016-10-02 MED ORDER — MIDAZOLAM HCL 5 MG/5ML IJ SOLN
INTRAMUSCULAR | Status: DC | PRN
  Administered 2016-10-02 (×2): 2 mg via INTRAVENOUS
  Administered 2016-10-02: 1 mg via INTRAVENOUS

## 2016-10-02 MED ORDER — SODIUM CHLORIDE 0.9 % IV SOLN
INTRAVENOUS | Status: DC
  Administered 2016-10-02: 08:00:00 via INTRAVENOUS

## 2016-10-02 MED ORDER — MEPERIDINE HCL 50 MG/ML IJ SOLN
INTRAMUSCULAR | Status: AC
  Filled 2016-10-02: qty 1

## 2016-10-02 NOTE — Discharge Instructions (Signed)
No aspirin or NSAIDs for 10 days. Resume other medications and high fiber diet. No driving for 24 hours. Physician will call with biopsy results.   Colonoscopy, Adult, Care After This sheet gives you information about how to care for yourself after your procedure. Your health care provider may also give you more specific instructions. If you have problems or questions, contact your health care provider. What can I expect after the procedure? After the procedure, it is common to have:  A small amount of blood in your stool for 24 hours after the procedure.  Some gas.  Mild abdominal cramping or bloating.  Follow these instructions at home: General instructions   For the first 24 hours after the procedure: ? Do not drive or use machinery. ? Do not sign important documents. ? Do not drink alcohol. ? Do your regular daily activities at a slower pace than normal. ? Eat soft, easy-to-digest foods. ? Rest often.  Take over-the-counter or prescription medicines only as told by your health care provider.  It is up to you to get the results of your procedure. Ask your health care provider, or the department performing the procedure, when your results will be ready. Relieving cramping and bloating  Try walking around when you have cramps or feel bloated.  Apply heat to your abdomen as told by your health care provider. Use a heat source that your health care provider recommends, such as a moist heat pack or a heating pad. ? Place a towel between your skin and the heat source. ? Leave the heat on for 20-30 minutes. ? Remove the heat if your skin turns bright red. This is especially important if you are unable to feel pain, heat, or cold. You may have a greater risk of getting burned. Eating and drinking  Drink enough fluid to keep your urine clear or pale yellow.  Resume your normal diet as instructed by your health care provider. Avoid heavy or fried foods that are hard to  digest.  Avoid drinking alcohol for as long as instructed by your health care provider. Contact a health care provider if:  You have blood in your stool 2-3 days after the procedure. Get help right away if:  You have more than a small spotting of blood in your stool.  You pass large blood clots in your stool.  Your abdomen is swollen.  You have nausea or vomiting.  You have a fever.  You have increasing abdominal pain that is not relieved with medicine. This information is not intended to replace advice given to you by your health care provider. Make sure you discuss any questions you have with your health care provider. Document Released: 10/16/2003 Document Revised: 11/26/2015 Document Reviewed: 05/15/2015 Elsevier Interactive Patient Education  2018 [employer].   Colon Polyps Polyps are tissue growths inside the body. Polyps can grow in many places, including the large intestine (colon). A polyp may be a round bump or a mushroom-shaped growth. You could have one polyp or several. Most colon polyps are noncancerous (benign). However, some colon polyps can become cancerous over time. What are the causes? The exact cause of colon polyps is not known. What increases the risk? This condition is more likely to develop in people who:  Have a family history of colon cancer or colon polyps.  Are older than 71 or older than 45 if they are African American.  Have inflammatory bowel disease, such as ulcerative colitis or Crohn disease.  Are overweight.  Smoke cigarettes.  Do not get enough exercise.  Drink too much alcohol.  Eat a diet that is: ? High in fat and red meat. ? Low in fiber.  Had childhood cancer that was treated with abdominal radiation.  What are the signs or symptoms? Most polyps do not cause symptoms. If you have symptoms, they may include:  Blood coming from your rectum when having a bowel movement.  Blood in your stool.The stool may look dark red  or black.  A change in bowel habits, such as constipation or diarrhea.  How is this diagnosed? This condition is diagnosed with a colonoscopy. This is a procedure that uses a lighted, flexible scope to look at the inside of your colon. How is this treated? Treatment for this condition involves removing any polyps that are found. Those polyps will then be tested for cancer. If cancer is found, your health care provider will talk to you about options for colon cancer treatment. Follow these instructions at home: Diet  Eat plenty of fiber, such as fruits, vegetables, and whole grains.  Eat foods that are high in calcium and vitamin D, such as milk, cheese, yogurt, eggs, liver, fish, and broccoli.  Limit foods high in fat, red meats, and processed meats, such as hot dogs, sausage, bacon, and lunch meats.  Maintain a healthy weight, or lose weight if recommended by your health care provider. General instructions  Do not smoke cigarettes.  Do not drink alcohol excessively.  Keep all follow-up visits as told by your health care provider. This is important. This includes keeping regularly scheduled colonoscopies. Talk to your health care provider about when you need a colonoscopy.  Exercise every day or as told by your health care provider. Contact a health care provider if:  You have new or worsening bleeding during a bowel movement.  You have new or increased blood in your stool.  You have a change in bowel habits.  You unexpectedly lose weight. This information is not intended to replace advice given to you by your health care provider. Make sure you discuss any questions you have with your health care provider. Document Released: 11/28/2003 Document Revised: 08/09/2015 Document Reviewed: 01/22/2015 Elsevier Interactive Patient Education  [employer].

## 2016-10-02 NOTE — H&P (Signed)
Mark Harris is an 76 y.o. male.   Chief Complaint: Patient is here for colonoscopy. HPI: Patient is 77 year old Caucasian male who was recently have heme-positive stool and is therefore undergoing diagnostic colonoscopy. He denies abdominal pain diarrhea constipation melena or rectal bleeding. He does not take aspirin. No history of epistaxis. He reports some soreness in right inguinal area from hernia repair that he had one month ago. Patient has never been screened for CRC. Family history is negative for CRC. CBC on 08/28/2016 was normal.  Past Medical History:  Diagnosis Date  . Asthma   . Dyspnea   . Glaucoma    right eye  . History of kidney stones   . Hypertension     Past Surgical History:  Procedure Laterality Date  . CIRCUMCISION N/A 09/01/2016   Procedure: CIRCUMCISION ADULT;  Surgeon: Cleon Gustin, MD;  Location: [facility];  Service: Urology;  Laterality: N/A;  2 HRS TOTAL  - CORDINATED CASE W/ DR Arnoldo Morale WHO WILL GO 2ND MCKENZIE NEEDS 60 MIN FOR CIRC [phone] MEDICARE-[id number] A BCBS-[id number]  . HERNIA REPAIR     umbilical  . INGUINAL HERNIA REPAIR Right 06/25/2012   Procedure: HERNIA REPAIR INGUINAL ADULT;  Surgeon: Donato Heinz, MD;  Location: [facility];  Service: General;  Laterality: Right;  . INGUINAL HERNIA REPAIR N/A 09/01/2016   Procedure: RECURRENT HERNIA REPAIR INGUINAL ADULT WITH MESH;  Surgeon: Aviva Signs, MD;  Location: [facility];  Service: General;  Laterality: N/A;  . INSERTION OF MESH Right 06/25/2012   Procedure: INSERTION OF MESH;  Surgeon: Donato Heinz, MD;  Location: [facility];  Service: General;  Laterality: Right;    Family History  Problem Relation Age of Onset  . Colon cancer Neg Hx    Social History:  reports that he has quit smoking. His smoking use included Cigars. He has never used smokeless tobacco. He reports that he drinks alcohol. He reports that he does not use drugs.  Allergies: No Known Allergies  Medications  Prior to Admission  Medication Sig Dispense Refill  . albuterol (PROVENTIL HFA;VENTOLIN HFA) 108 (90 BASE) MCG/ACT inhaler Inhale 2 puffs into the lungs every 6 (six) hours as needed (for asthma).     . bimatoprost (LUMIGAN) 0.01 % SOLN Place 1 drop into both eyes at bedtime.    . Brinzolamide-Brimonidine (SIMBRINZA) 1-0.2 % SUSP Place 1 drop into the right eye 3 (three) times daily.    Marland Kitchen olmesartan-hydrochlorothiazide (BENICAR HCT) 40-25 MG tablet Take 0.5 tablets by mouth daily with lunch.     . Potassium 99 MG TABS Take 99 mg by mouth 2 (two) times a week.     . SYMBICORT 80-4.5 MCG/ACT inhaler Inhale 2 puffs into the lungs daily as needed. For breathing/respiratory issues    . oxyCODONE-acetaminophen (ROXICET) 5-325 MG tablet Take 1 tablet by mouth every 6 (six) hours as needed. (Patient not taking: Reported on 09/30/2016) 30 tablet 0    No results found for this or any previous visit (from the past 48 hour(s)). No results found.  ROS  Blood pressure (!) 188/70, pulse (!) 59, temperature 98.1 F (36.7 C), temperature source Oral, resp. rate 17, height 5\' 6"  (1.676 m), weight 192 lb (87.1 kg), SpO2 98 %. Physical Exam  Constitutional: He appears well-developed and well-nourished.  HENT:  Mouth/Throat: Oropharynx is clear and moist.  Eyes: Conjunctivae are normal. No scleral icterus.  Neck: No thyromegaly present.  Cardiovascular: Normal rate, regular rhythm and normal heart sounds.  No murmur heard. Respiratory: Effort normal and breath sounds normal.  GI:  Abdomen is full. Scars noted at the right inguinal region. Abdomen soft and nontender without organomegaly or masses  Musculoskeletal: He exhibits no edema.  Lymphadenopathy:    He has no cervical adenopathy.  Neurological: He is alert.  Skin: Skin is warm and dry.     Assessment/Plan Heme-positive stool. Diagnostic colonoscopy.  Hildred Laser, MD 10/02/2016, 8:23 AM

## 2016-10-02 NOTE — Op Note (Addendum)
[facility] Patient Name: Mark Harris Procedure Date: 10/02/2016 8:22 AM MRN: [id number] Date of Birth: 02-05-1941 Attending MD: Hildred Laser , MD CSN: [id number] Age: 76 Admit Type: Outpatient Procedure:                Colonoscopy Indications:              Heme positive stool Providers:                Hildred Laser, MD, Otis Peak B. Sharon Seller, RN, Bonnetta Barry, Technician Referring MD:             Halford Chessman, MD Medicines:                Meperidine 50 mg IV, Midazolam 5 mg IV Complications:            No immediate complications. Estimated Blood Loss:     Estimated blood loss: none. Procedure:                Pre-Anesthesia Assessment:                           - Prior to the procedure, a History and Physical                            was performed, and patient medications and                            allergies were reviewed. The patient's tolerance of                            previous anesthesia was also reviewed. The risks                            and benefits of the procedure and the sedation                            options and risks were discussed with the patient.                            All questions were answered, and informed consent                            was obtained. Prior Anticoagulants: The patient has                            taken no previous anticoagulant or antiplatelet                            agents. ASA Grade Assessment: II - A patient with                            mild systemic disease. After reviewing the risks  and benefits, the patient was deemed in                            satisfactory condition to undergo the procedure.                           After obtaining informed consent, the colonoscope                            was passed under direct vision. Throughout the                            procedure, the patient's blood pressure, pulse, and   oxygen saturations were monitored continuously. The                            [id number]) was introduced through the anus                            and advanced to the the cecum, identified by the                            ileocecal valve. The colonoscopy was performed                            without difficulty. The patient tolerated the                            procedure well. The quality of the bowel                            preparation was good. The entire colon was                            examined. The ileocecal valve and the rectum were                            photographed. Scope In: 8:33:54 AM Scope Out: 9:06:03 AM Scope Withdrawal Time: 0 hours 29 minutes 34 seconds  Total Procedure Duration: 0 hours 32 minutes 9 seconds  Findings:      The perianal and digital rectal examinations were normal.      A 40 mm polyp was found in the cecum. The polyp was multi-lobulated. The       polyp was removed with a piecemeal technique using a hot snare. Polyp       resection was incomplete. The resected tissue was retrieved. Fulguration       to ablate the remaining base of the lesion by argon plasma was       successful. The pathology specimen was placed into Bottle Number 1.      Scattered medium-mouthed diverticula were found in the sigmoid colon.      External hemorrhoids were found during retroflexion. The hemorrhoids       were small. Impression:               - One 40 mm polyp in the cecum,  removed piecemeal                            using a hot snare. Incomplete resection. Resected                            tissue retrieved. Treated with argon plasma                            coagulation (APC).                           - Diverticulosis in the sigmoid colon.                           - External hemorrhoids. Moderate Sedation:      Moderate (conscious) sedation was administered by the endoscopy nurse       and supervised by the endoscopist. The following  parameters were       monitored: oxygen saturation, heart rate, blood pressure, CO2       capnography and response to care. Total physician intraservice time was       30 minutes. Recommendation:           - Patient has a contact number available for                            emergencies. The signs and symptoms of potential                            delayed complications were discussed with the                            patient. Return to normal activities tomorrow.                            Written discharge instructions were provided to the                            patient.                           - High fiber diet today.                           - Continue present medications.                           - No aspirin, ibuprofen, naproxen, or other                            non-steroidal anti-inflammatory drugs for 10 days                            after polyp removal.                           - Await pathology results.                           -  Repeat colonoscopy for surveillance based on                            pathology results. Procedure Code(s):        --- Professional ---                           [phone], Colonoscopy, flexible; with removal of                            tumor(s), polyp(s), or other lesion(s) by snare                            technique                           99152, Moderate sedation services provided by the                            same physician or other qualified health care                            professional performing the diagnostic or                            therapeutic service that the sedation supports,                            requiring the presence of an independent trained                            observer to assist in the monitoring of the                            patient's level of consciousness and physiological                            status; initial 15 minutes of intraservice time,                             patient age 75 years or older                           [phone], Moderate sedation services; each additional                            15 minutes intraservice time Diagnosis Code(s):        --- Professional ---                           D12.0, Benign neoplasm of cecum                           K64.4, Residual hemorrhoidal skin tags  R19.5, Other fecal abnormalities                           K57.30, Diverticulosis of large intestine without                            perforation or abscess without bleeding CPT copyright 2016 American Medical Association. All rights reserved. The codes documented in this report are preliminary and upon coder review may  be revised to meet current compliance requirements. Hildred Laser, MD Hildred Laser, MD 10/02/2016 9:16:24 AM This report has been signed electronically. Number of Addenda: 0

## 2016-10-06 ENCOUNTER — Other Ambulatory Visit (INDEPENDENT_AMBULATORY_CARE_PROVIDER_SITE_OTHER): Payer: Self-pay | Admitting: *Deleted

## 2016-10-06 DIAGNOSIS — R195 Other fecal abnormalities: Secondary | ICD-10-CM

## 2016-10-06 DIAGNOSIS — D374 Neoplasm of uncertain behavior of colon: Secondary | ICD-10-CM

## 2016-10-06 DIAGNOSIS — Z86010 Personal history of colonic polyps: Secondary | ICD-10-CM

## 2016-10-07 ENCOUNTER — Encounter (HOSPITAL_COMMUNITY): Payer: Self-pay | Admitting: Internal Medicine

## 2016-10-08 ENCOUNTER — Ambulatory Visit (HOSPITAL_COMMUNITY)
Admission: RE | Admit: 2016-10-08 | Discharge: 2016-10-08 | Disposition: A | Discharge status: Home / Self Care | Payer: Medicare Other | Source: Ambulatory Visit | Attending: Internal Medicine | Admitting: Internal Medicine

## 2016-10-08 DIAGNOSIS — Z86010 Personal history of colonic polyps: Secondary | ICD-10-CM | POA: Diagnosis not present

## 2016-10-08 DIAGNOSIS — I700 Atherosclerosis of aorta: Secondary | ICD-10-CM | POA: Diagnosis not present

## 2016-10-08 DIAGNOSIS — R195 Other fecal abnormalities: Secondary | ICD-10-CM | POA: Diagnosis not present

## 2016-10-08 DIAGNOSIS — K5730 Diverticulosis of large intestine without perforation or abscess without bleeding: Secondary | ICD-10-CM | POA: Diagnosis not present

## 2016-10-08 DIAGNOSIS — D374 Neoplasm of uncertain behavior of colon: Secondary | ICD-10-CM | POA: Insufficient documentation

## 2016-10-08 DIAGNOSIS — K4090 Unilateral inguinal hernia, without obstruction or gangrene, not specified as recurrent: Secondary | ICD-10-CM | POA: Diagnosis not present

## 2016-10-08 LAB — POCT I-STAT CREATININE: Creatinine, Ser: 0.8 mg/dL (ref 0.61–1.24)

## 2016-10-08 MED ORDER — IOPAMIDOL (ISOVUE-300) INJECTION 61%
100.0000 mL | Freq: Once | INTRAVENOUS | Status: AC | PRN
  Administered 2016-10-08: 100 mL via INTRAVENOUS

## 2016-10-23 ENCOUNTER — Encounter (HOSPITAL_COMMUNITY): Payer: Self-pay | Admitting: Urology

## 2016-10-28 ENCOUNTER — Ambulatory Visit (INDEPENDENT_AMBULATORY_CARE_PROVIDER_SITE_OTHER): Payer: Medicare Other | Admitting: General Surgery

## 2016-10-28 ENCOUNTER — Encounter: Payer: Self-pay | Admitting: General Surgery

## 2016-10-28 VITALS — BP 149/72 | HR 89 | Temp 97.5°F | Resp 18 | Ht 66.0 in | Wt 192.0 lb

## 2016-10-28 DIAGNOSIS — K635 Polyp of colon: Secondary | ICD-10-CM | POA: Diagnosis not present

## 2016-10-28 MED ORDER — PEG 3350-KCL-NABCB-NACL-NASULF 236 G PO SOLR: 4000.0000 mL | Freq: Once | ORAL | 0 refills | Status: AC

## 2016-10-28 MED ORDER — NEOMYCIN SULFATE 500 MG PO TABS: 1000.0000 mg | ORAL_TABLET | Freq: Three times a day (TID) | ORAL | 0 refills | Status: DC

## 2016-10-28 MED ORDER — METRONIDAZOLE 500 MG PO TABS: 500.0000 mg | ORAL_TABLET | Freq: Three times a day (TID) | ORAL | 0 refills | Status: AC

## 2016-10-28 NOTE — H&P (Signed)
Mark Harris; [id number]; June 25, 1940   HPI Patient is a 76 year old white male who is referred to my care by Dr. Laural Golden for evaluation and treatment of a cecal polypoid lesion that was found on colonoscopy. 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 Patient denies any family history of colon cancer. He currently has no pain. He denies any blood per rectum. He has had no weight loss. Past Medical History:  Diagnosis Date  . Asthma   . Dyspnea   . Glaucoma    right eye  . History of kidney stones   . Hypertension     Past Surgical History:  Procedure Laterality Date  . CIRCUMCISION N/A 09/01/2016   Procedure: CIRCUMCISION ADULT;  Surgeon: Cleon Gustin, MD;  Location: [facility];  Service: Urology;  Laterality: N/A;  2 HRS TOTAL  - CORDINATED CASE W/ DR Arnoldo Morale WHO WILL GO 2ND MCKENZIE NEEDS 60 MIN FOR CIRC [phone] MEDICARE-[id number] A BCBS-[id number]  . COLONOSCOPY N/A 10/02/2016   Procedure: COLONOSCOPY;  Surgeon: Rogene Houston, MD;  Location: [facility];  Service: Endoscopy;  Laterality: N/A;  1:00  . HERNIA REPAIR     umbilical  . INGUINAL HERNIA REPAIR Right 06/25/2012   Procedure: HERNIA REPAIR INGUINAL ADULT;  Surgeon: Donato Heinz, MD;  Location: [facility];  Service: General;  Laterality: Right;  . INGUINAL HERNIA REPAIR N/A 09/01/2016   Procedure: RECURRENT HERNIA REPAIR INGUINAL ADULT WITH MESH;  Surgeon: Aviva Signs, MD;  Location: [facility];  Service: General;  Laterality: N/A;  . INSERTION OF MESH Right 06/25/2012   Procedure: INSERTION OF MESH;  Surgeon: Donato Heinz, MD;  Location: [facility];  Service: General;  Laterality: Right;  . POLYPECTOMY  10/02/2016   Procedure: POLYPECTOMY;  Surgeon: Rogene Houston, MD;  Location: [facility];  Service: Endoscopy;;  colon    Family History  Problem Relation Age of Onset  . Colon cancer Neg Hx     Current Outpatient Prescriptions on File Prior to Visit  Medication Sig Dispense Refill   . albuterol (PROVENTIL HFA;VENTOLIN HFA) 108 (90 BASE) MCG/ACT inhaler Inhale 2 puffs into the lungs every 6 (six) hours as needed (for asthma).     . bimatoprost (LUMIGAN) 0.01 % SOLN Place 1 drop into both eyes at bedtime.    . Brinzolamide-Brimonidine (SIMBRINZA) 1-0.2 % SUSP Place 1 drop into the right eye 3 (three) times daily.    Marland Kitchen olmesartan-hydrochlorothiazide (BENICAR HCT) 40-25 MG tablet Take 0.5 tablets by mouth daily with lunch.     . Potassium 99 MG TABS Take 99 mg by mouth 2 (two) times a week.     . SYMBICORT 80-4.5 MCG/ACT inhaler Inhale 2 puffs into the lungs daily as needed. For breathing/respiratory issues     No current facility-administered medications on file prior to visit.     No Known Allergies  History  Alcohol Use  . Yes    Comment: one glass of wine a day    History  Smoking Status  . Former Smoker  . Types: Cigars  Smokeless Tobacco  . Never Used    Comment: "chewed" cigars    Review of Systems  Constitutional: Negative.   HENT: Negative.   Eyes: Negative.   Respiratory: Negative.   Cardiovascular: Negative.   Gastrointestinal: Negative.   Genitourinary: Negative.   Musculoskeletal: Negative.   Skin: Negative.   Neurological: Negative.   Endo/Heme/Allergies: Negative.   Psychiatric/Behavioral: Negative.     Objective   Vitals:  10/28/16 0857  BP: (!) 149/72  Pulse: 89  Resp: 18  Temp: (!) 97.5 F (36.4 C)    Physical Exam  Constitutional: He is oriented to person, place, and time and well-developed, well-nourished, and in no distress.  HENT:  Head: Normocephalic and atraumatic.  Neck: Normal range of motion. Neck supple.  Cardiovascular: Normal rate, regular rhythm and normal heart sounds.   No murmur heard. Pulmonary/Chest: Effort normal and breath sounds normal. He has no wheezes. He has no rales.  Abdominal: Soft. Bowel sounds are normal. He exhibits no distension. There is no tenderness. There is no rebound.   Neurological: He is alert and oriented to person, place, and time.  Skin: Skin is warm and dry.  Vitals reviewed.  Dr. Olevia Perches notes reviewed. Pathology report reviewed. Assessment Plan    Dysplastic cecal polyp   Patient scheduled for a partial colectomy on 11/03/2016. The risks and benefits of the procedure including bleeding, infection, anastomotic leak, and the possibility of malignancy were fully explained to the patient, who gave informed consent. GoLYTELY, neomycin, and Flagyl have all been ordered preoperatively for bowel prep.

## 2016-10-28 NOTE — Progress Notes (Signed)
Mark Harris; [id number]; 02/09/1941   HPI Patient is a 76 year old white male who is referred to my care by Dr. Laural Golden for evaluation and treatment of a cecal polypoid lesion that was found on colonoscopy. 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 Patient denies any family history of colon cancer. He currently has no pain. He denies any blood per rectum. He has had no weight loss. Past Medical History:  Diagnosis Date  . Asthma   . Dyspnea   . Glaucoma    right eye  . History of kidney stones   . Hypertension     Past Surgical History:  Procedure Laterality Date  . CIRCUMCISION N/A 09/01/2016   Procedure: CIRCUMCISION ADULT;  Surgeon: Cleon Gustin, MD;  Location: [facility];  Service: Urology;  Laterality: N/A;  2 HRS TOTAL  - CORDINATED CASE W/ DR Arnoldo Morale WHO WILL GO 2ND [place] NEEDS 60 MIN FOR CIRC [phone] MEDICARE-[id number] [employer]-[id number]  . COLONOSCOPY N/A 10/02/2016   Procedure: COLONOSCOPY;  Surgeon: Rogene Houston, MD;  Location: [facility];  Service: Endoscopy;  Laterality: N/A;  1:00  . HERNIA REPAIR     umbilical  . INGUINAL HERNIA REPAIR Right 06/25/2012   Procedure: HERNIA REPAIR INGUINAL ADULT;  Surgeon: Donato Heinz, MD;  Location: [facility];  Service: General;  Laterality: Right;  . INGUINAL HERNIA REPAIR N/A 09/01/2016   Procedure: RECURRENT HERNIA REPAIR INGUINAL ADULT WITH MESH;  Surgeon: Aviva Signs, MD;  Location: [facility];  Service: General;  Laterality: N/A;  . INSERTION OF MESH Right 06/25/2012   Procedure: INSERTION OF MESH;  Surgeon: Donato Heinz, MD;  Location: [facility];  Service: General;  Laterality: Right;  . POLYPECTOMY  10/02/2016   Procedure: POLYPECTOMY;  Surgeon: Rogene Houston, MD;  Location: [facility];  Service: Endoscopy;;  colon    Family History  Problem Relation Age of Onset  . Colon cancer Neg Hx     Current Outpatient Prescriptions on File Prior to Visit  Medication Sig Dispense Refill   . albuterol (PROVENTIL HFA;VENTOLIN HFA) 108 (90 BASE) MCG/ACT inhaler Inhale 2 puffs into the lungs every 6 (six) hours as needed (for asthma).     . bimatoprost (LUMIGAN) 0.01 % SOLN Place 1 drop into both eyes at bedtime.    . Brinzolamide-Brimonidine (SIMBRINZA) 1-0.2 % SUSP Place 1 drop into the right eye 3 (three) times daily.    Marland Kitchen olmesartan-hydrochlorothiazide (BENICAR HCT) 40-25 MG tablet Take 0.5 tablets by mouth daily with lunch.     . Potassium 99 MG TABS Take 99 mg by mouth 2 (two) times a week.     . SYMBICORT 80-4.5 MCG/ACT inhaler Inhale 2 puffs into the lungs daily as needed. For breathing/respiratory issues     No current facility-administered medications on file prior to visit.     No Known Allergies  History  Alcohol Use  . Yes    Comment: one glass of wine a day    History  Smoking Status  . Former Smoker  . Types: Cigars  Smokeless Tobacco  . Never Used    Comment: "chewed" cigars    Review of Systems  Constitutional: Negative.   HENT: Negative.   Eyes: Negative.   Respiratory: Negative.   Cardiovascular: Negative.   Gastrointestinal: Negative.   Genitourinary: Negative.   Musculoskeletal: Negative.   Skin: Negative.   Neurological: Negative.   Endo/Heme/Allergies: Negative.   Psychiatric/Behavioral: Negative.     Objective   Vitals:  10/28/16 0857  BP: (!) 149/72  Pulse: 89  Resp: 18  Temp: (!) 97.5 F (36.4 C)    Physical Exam  Constitutional: He is oriented to person, place, and time and well-developed, well-nourished, and in no distress.  HENT:  Head: Normocephalic and atraumatic.  Neck: Normal range of motion. Neck supple.  Cardiovascular: Normal rate, regular rhythm and normal heart sounds.   No murmur heard. Pulmonary/Chest: Effort normal and breath sounds normal. He has no wheezes. He has no rales.  Abdominal: Soft. Bowel sounds are normal. He exhibits no distension. There is no tenderness. There is no rebound.   Neurological: He is alert and oriented to person, place, and time.  Skin: Skin is warm and dry.  Vitals reviewed.  Dr. Olevia Perches notes reviewed. Pathology report reviewed. Assessment Plan    Dysplastic cecal polyp   Patient scheduled for a partial colectomy on 11/03/2016. The risks and benefits of the procedure including bleeding, infection, anastomotic leak, and the possibility of malignancy were fully explained to the patient, who gave informed consent. GoLYTELY, neomycin, and Flagyl have all been ordered preoperatively for bowel prep.

## 2016-10-28 NOTE — Patient Instructions (Signed)
Open Colectomy An open colectomy is surgery to remove part or all of the large intestine (colon). This procedure may be used to treat several conditions, including:  Inflammation and infection of the colon (diverticulitis).  Tumors or masses in the colon.  Inflammatory bowel disease, such as Crohn disease or ulcerative colitis.  Bleeding from the colon.  Blockage or obstruction of the colon.  Tell a health care provider about:  Any allergies you have.  All medicines you are taking, including vitamins, herbs, eye drops, creams, and over-the-counter medicines.  Any problems you or family members have had with anesthetic medicines.  Any blood disorders you have.  Any surgeries you have had.  Any medical conditions you have.  Whether you are pregnant or may be pregnant.  Whether you smoke or use tobacco products. These can affect your body's reaction to anesthesia. What are the risks? Generally, this is a safe procedure. However, problems may occur, including:  Infection.  Bleeding.  Allergic reactions to medicines.  Damage to other structures or organs.  Pneumonia.  The incision opening up.  Tissues from inside the abdomen bulging through the incision (hernia).  Reopening of the colon where it was stitched or stapled together.  A blood clot forming in a vein and traveling to the lungs.  Future blockage of the small intestine from scar tissue.  What happens before the procedure? Staying hydrated Follow instructions from your health care provider about hydration, which may include:  Up to 2 hours before the procedure - you may continue to drink clear liquids, such as water, clear fruit juice, black coffee, and plain tea.  Eating and drinking restrictions Follow instructions from your health care provider about eating and drinking, which may include:  8 hours before the procedure - stop eating heavy meals or foods such as meat, fried foods, or fatty foods.  6  hours before the procedure - stop eating light meals or foods, such as toast or cereal.  6 hours before the procedure - stop drinking milk or drinks that contain milk.  2 hours before the procedure - stop drinking clear liquids.  Bowel prep In some cases, you may be prescribed an oral bowel prep to clean out your colon. If so:  Take it as told by your health care provider. Starting the day before your procedure, you may need to drink a large amount of medicated liquid. The liquid will cause you to have multiple loose stools until your stool is almost clear or light green.  Follow instructions from your health care provider about eating and drinking restrictions during bowel prep.  Medicines  Ask your health care provider about: ? Changing or stopping your regular medicines or vitamins. This is especially important if you are taking diabetes medicines, blood thinners, or vitamin E. ? Taking medicines such as aspirin and ibuprofen. These medicines can thin your blood. Do not take these medicines before your procedure if your health care provider instructs you not to.  If you were prescribed an antibiotic medicine, take it as told by your health care provider. General instructions  Bring loose-fitting, comfortable clothing and slip-on shoes that you can put on without bending over.  Make sure to see your health care provider for any tests that you need before the procedure, such as: ? Blood tests. ? A test to check the heart's rhythm (electrocardiogram, ECG). ? A CT scan of your abdomen. ? Urine tests. ? Colonoscopy.  Plan to have someone take you home from the   hospital or clinic.  Arrange for someone to help you with your activities during your recovery. What happens during the procedure?  To reduce your risk of infection: ? Your health care team will wash or sanitize their hands. ? Your skin will be washed with soap. ? Hair may be removed from the surgical area.  An IV tube  will be inserted into one of your veins. The tube will be used to give you medicines and fluids.  You will be given a medicine to make you fall asleep (general anesthetic). You may also be given a medicine to help you relax (sedative).  Small monitors will be connected to your body. They will be used to check your heart, blood pressure, and oxygen level.  A breathing tube may be placed into your lungs during the procedure.  A thin, flexible tube (catheter) will be placed into your bladder to drain urine.  A tube may be inserted through your nose and into your stomach (nasogastric tube, or NG tube). The tube is used to remove stomach fluids after surgery until the intestines start working again.  An incision will be made in your abdomen.  Clamps or staples will be put on your colon.  The part of the colon between the clamps or staples will be removed.  The ends of the colon that remain will be stitched or stapled together.  The incision in your abdomen will be closed with stitches (sutures) or staples.  The incision will be covered with a bandage (dressing).  A small opening (stoma) may be created in your lower abdomen. A removable, external pouch (ostomy pouch) will be attached to the stoma. This pouch will collect stool outside of your body. Stool passes through the stoma and into the pouch instead of through your anus. The procedure may vary among health care providers and hospitals. What happens after the procedure?  Your blood pressure, heart rate, breathing rate, and blood oxygen level will be monitored until the medicines you were given have worn off.  You may continue to receive fluids and medicines through an IV tube.  You will start on a clear liquid diet and gradually go back to a normal diet.  Do not drive until your health care provider approves.  You may have some pain in your abdomen. You will be given pain medicine to control the pain.  You will be encouraged to  do the following: ? Do breathing exercises to prevent pneumonia. ? Get up and start walking within a day after surgery. You should try to get up 5-6 times a day. This information is not intended to replace advice given to you by your health care provider. Make sure you discuss any questions you have with your health care provider. Document Released: 12/29/2008 Document Revised: 12/03/2015 Document Reviewed: 12/03/2015 [employer] Interactive Patient Education  2018 [employer]  

## 2016-10-29 NOTE — Patient Instructions (Signed)
TERION HEDMAN  10/29/2016     @PREFPERIOPPHARMACY @   Your procedure is scheduled on  11/03/2016   Report to [facility] at  615  A.M.  Call this number if you have problems the morning of surgery:  [phone]   Remember:  Do not eat food or drink liquids after midnight.  Take these medicines the morning of surgery with A SIP OF WATER  Benicar. Use your inhalers before you come.   Do not wear jewelry, make-up or nail polish.  Do not wear lotions, powders, or perfumes, or deoderant.  Do not shave 48 hours prior to surgery.  Men may shave face and neck.  Do not bring valuables to the hospital.  [facility] is not responsible for any belongings or valuables.  Contacts, dentures or bridgework may not be worn into surgery.  Leave your suitcase in the car.  After surgery it may be brought to your room.  For patients admitted to the hospital, discharge time will be determined by your treatment team.  Patients discharged the day of surgery will not be allowed to drive home.   Name and phone number of your driver:   family Special instructions:  Follow the diet and prep instructions given to you by Dr Adline Mango office.  Please read over the following fact sheets that you were given. Pain Booklet, Coughing and Deep Breathing, Blood Transfusion Information, Surgical Site Infection Prevention, Anesthesia Post-op Instructions and Care and Recovery After Surgery       Open Colectomy An open colectomy is surgery to remove part or all of the large intestine (colon). This procedure may be used to treat several conditions, including:  Inflammation and infection of the colon (diverticulitis).  Tumors or masses in the colon.  Inflammatory bowel disease, such as Crohn disease or ulcerative colitis.  Bleeding from the colon.  Blockage or obstruction of the colon.  Tell a health care provider about:  Any allergies you have.  All medicines you are taking, including  vitamins, herbs, eye drops, creams, and over-the-counter medicines.  Any problems you or family members have had with anesthetic medicines.  Any blood disorders you have.  Any surgeries you have had.  Any medical conditions you have.  Whether you are pregnant or may be pregnant.  Whether you smoke or use tobacco products. These can affect your body's reaction to anesthesia. What are the risks? Generally, this is a safe procedure. However, problems may occur, including:  Infection.  Bleeding.  Allergic reactions to medicines.  Damage to other structures or organs.  Pneumonia.  The incision opening up.  Tissues from inside the abdomen bulging through the incision (hernia).  Reopening of the colon where it was stitched or stapled together.  A blood clot forming in a vein and traveling to the lungs.  Future blockage of the small intestine from scar tissue.  What happens before the procedure? Staying hydrated Follow instructions from your health care provider about hydration, which may include:  Up to 2 hours before the procedure - you may continue to drink clear liquids, such as water, clear fruit juice, black coffee, and plain tea.  Eating and drinking restrictions Follow instructions from your health care provider about eating and drinking, which may include:  8 hours before the procedure - stop eating heavy meals or foods such as meat, fried foods, or fatty foods.  6 hours before the procedure - stop eating light meals or  foods, such as toast or cereal.  6 hours before the procedure - stop drinking milk or drinks that contain milk.  2 hours before the procedure - stop drinking clear liquids.  Bowel prep In some cases, you may be prescribed an oral bowel prep to clean out your colon. If so:  Take it as told by your health care provider. Starting the day before your procedure, you may need to drink a large amount of medicated liquid. The liquid will cause you to  have multiple loose stools until your stool is almost clear or light green.  Follow instructions from your health care provider about eating and drinking restrictions during bowel prep.  Medicines  Ask your health care provider about: ? Changing or stopping your regular medicines or vitamins. This is especially important if you are taking diabetes medicines, blood thinners, or vitamin E. ? Taking medicines such as aspirin and ibuprofen. These medicines can thin your blood. Do not take these medicines before your procedure if your health care provider instructs you not to.  If you were prescribed an antibiotic medicine, take it as told by your health care provider. General instructions  Bring loose-fitting, comfortable clothing and slip-on shoes that you can put on without bending over.  Make sure to see your health care provider for any tests that you need before the procedure, such as: ? Blood tests. ? A test to check the heart's rhythm (electrocardiogram, ECG). ? A CT scan of your abdomen. ? Urine tests. ? Colonoscopy.  Plan to have someone take you home from the hospital or clinic.  Arrange for someone to help you with your activities during your recovery. What happens during the procedure?  To reduce your risk of infection: ? Your health care team will wash or sanitize their hands. ? Your skin will be washed with soap. ? Hair may be removed from the surgical area.  An IV tube will be inserted into one of your veins. The tube will be used to give you medicines and fluids.  You will be given a medicine to make you fall asleep (general anesthetic). You may also be given a medicine to help you relax (sedative).  Small monitors will be connected to your body. They will be used to check your heart, blood pressure, and oxygen level.  A breathing tube may be placed into your lungs during the procedure.  A thin, flexible tube (catheter) will be placed into your bladder to drain  urine.  A tube may be inserted through your nose and into your stomach (nasogastric tube, or NG tube). The tube is used to remove stomach fluids after surgery until the intestines start working again.  An incision will be made in your abdomen.  Clamps or staples will be put on your colon.  The part of the colon between the clamps or staples will be removed.  The ends of the colon that remain will be stitched or stapled together.  The incision in your abdomen will be closed with stitches (sutures) or staples.  The incision will be covered with a bandage (dressing).  A small opening (stoma) may be created in your lower abdomen. A removable, external pouch (ostomy pouch) will be attached to the stoma. This pouch will collect stool outside of your body. Stool passes through the stoma and into the pouch instead of through your anus. The procedure may vary among health care providers and hospitals. What happens after the procedure?  Your blood pressure, heart rate, breathing  rate, and blood oxygen level will be monitored until the medicines you were given have worn off.  You may continue to receive fluids and medicines through an IV tube.  You will start on a clear liquid diet and gradually go back to a normal diet.  Do not drive until your health care provider approves.  You may have some pain in your abdomen. You will be given pain medicine to control the pain.  You will be encouraged to do the following: ? Do breathing exercises to prevent pneumonia. ? Get up and start walking within a day after surgery. You should try to get up 5-6 times a day. This information is not intended to replace advice given to you by your health care provider. Make sure you discuss any questions you have with your health care provider. Document Released: 12/29/2008 Document Revised: 12/03/2015 Document Reviewed: 12/03/2015 Elsevier Interactive Patient Education  2018 [employer].  Open Colectomy, Care  After This sheet gives you information about how to care for yourself after your procedure. Your health care provider may also give you more specific instructions. If you have problems or questions, contact your health care provider. What can I expect after the procedure? After the procedure, it is common to have:  Pain in your abdomen, especially along your incision.  Tiredness. Your energy level will return to normal over the next several weeks.  Constipation.  Nausea.  Difficulty urinating.  Follow these instructions at home: Activity  You may be able to return to most of your normal activities within 1-2 weeks, such as working, walking up stairs, and sexual activity.  Avoid activities that require a lot of energy for 4-6 weeks after surgery, such as running, climbing, and lifting heavy objects. Ask your health care provider what activities are safe for you.  Take rest breaks during the day as needed.  Do not drive for 1-2 weeks or until your health care provider says that it is safe.  Do not drive or use heavy machinery while taking prescription pain medicines.  Do not lift anything that is heavier than 10 lb (4.3 kg) until your health care provider says that it is safe. Incision care  Follow instructions from your health care provider about how to take care of your incision. Make sure you: ? Wash your hands with soap and water before you change your bandage (dressing). If soap and water are not available, use hand sanitizer. ? Change your dressing as told by your health care provider. ? Leave stitches (sutures) or staples in place. These skin closures may need to stay in place for 2 weeks or longer.  Avoid wearing tight clothing around your incision.  Protect your incision area from the sun.  Check your incision area every day for signs of infection. Check for: ? More redness, swelling, or pain. ? More fluid or blood. ? Warmth. ? Pus or a bad smell. General  instructions  Do not take baths, swim, or use a hot tub until your health care provider approves. Ask your health care provider when you may shower.  Take over-the-counter and prescription medicines, including stool softeners, only as told by your health care provider.  Eat a low-fat and low-fiber diet for the first 4 weeks after surgery.  Keep all follow-up visits as told by your health care provider. This is important. Contact a health care provider if:  You have more redness, swelling, or pain around your incision.  You have more fluid or blood coming  from your incision.  Your incision feels warm to the touch.  You have pus or a bad smell coming from your incision.  You have a fever or chills.  You do not have a bowel movement 2-3 days after surgery.  You cannot eat or drink for 24 hours or more.  You have persistent nausea and vomiting.  You have abdominal pain that gets worse and does not get better with medicine. Get help right away if:  You have chest pain.  You have shortness of breath.  You have pain or swelling in your legs.  Your incision breaks open after your sutures or staples have been removed.  You have bleeding from the rectum. This information is not intended to replace advice given to you by your health care provider. Make sure you discuss any questions you have with your health care provider. Document Released: 09/24/2010 Document Revised: 12/03/2015 Document Reviewed: 12/03/2015 Elsevier Interactive Patient Education  2018 [place] Anesthesia, Adult General anesthesia is the use of medicines to make a person "go to sleep" (be unconscious) for a medical procedure. General anesthesia is often recommended when a procedure:  Is long.  Requires you to be still or in an unusual position.  Is major and can cause you to lose blood.  Is impossible to do without general anesthesia.  The medicines used for general anesthesia are called  general anesthetics. In addition to making you sleep, the medicines:  Prevent pain.  Control your blood pressure.  Relax your muscles.  Tell a health care provider about:  Any allergies you have.  All medicines you are taking, including vitamins, herbs, eye drops, creams, and over-the-counter medicines.  Any problems you or family members have had with anesthetic medicines.  Types of anesthetics you have had in the past.  Any bleeding disorders you have.  Any surgeries you have had.  Any medical conditions you have.  Any history of heart or lung conditions, such as heart failure, sleep apnea, or chronic obstructive pulmonary disease (COPD).  Whether you are pregnant or may be pregnant.  Whether you use tobacco, alcohol, marijuana, or street drugs.  Any history of Armed forces logistics/support/administrative officer.  Any history of depression or anxiety. What are the risks? Generally, this is a safe procedure. However, problems may occur, including:  Allergic reaction to anesthetics.  Lung and heart problems.  Inhaling food or liquids from your stomach into your lungs (aspiration).  Injury to nerves.  Waking up during your procedure and being unable to move (rare).  Extreme agitation or a state of mental confusion (delirium) when you wake up from the anesthetic.  Air in the bloodstream, which can lead to stroke.  These problems are more likely to develop if you are having a major surgery or if you have an advanced medical condition. You can prevent some of these complications by answering all of your health care provider's questions thoroughly and by following all pre-procedure instructions. General anesthesia can cause side effects, including:  Nausea or vomiting  A sore throat from the breathing tube.  Feeling cold or shivery.  Feeling tired, washed out, or achy.  Sleepiness or drowsiness.  Confusion or agitation.  What happens before the procedure? Staying hydrated Follow  instructions from your health care provider about hydration, which may include:  Up to 2 hours before the procedure - you may continue to drink clear liquids, such as water, clear fruit juice, black coffee, and plain tea.  Eating and drinking restrictions Follow  instructions from your health care provider about eating and drinking, which may include:  8 hours before the procedure - stop eating heavy meals or foods such as meat, fried foods, or fatty foods.  6 hours before the procedure - stop eating light meals or foods, such as toast or cereal.  6 hours before the procedure - stop drinking milk or drinks that contain milk.  2 hours before the procedure - stop drinking clear liquids.  Medicines  Ask your health care provider about: ? Changing or stopping your regular medicines. This is especially important if you are taking diabetes medicines or blood thinners. ? Taking medicines such as aspirin and ibuprofen. These medicines can thin your blood. Do not take these medicines before your procedure if your health care provider instructs you not to. ? Taking new dietary supplements or medicines. Do not take these during the week before your procedure unless your health care provider approves them.  If you are told to take a medicine or to continue taking a medicine on the day of the procedure, take the medicine with sips of water. General instructions   Ask if you will be going home the same day, the following day, or after a longer hospital stay. ? Plan to have someone take you home. ? Plan to have someone stay with you for the first 24 hours after you leave the hospital or clinic.  For 3-6 weeks before the procedure, try not to use any tobacco products, such as cigarettes, chewing tobacco, and e-cigarettes.  You may brush your teeth on the morning of the procedure, but make sure to spit out the toothpaste. What happens during the procedure?  You will be given anesthetics through a  mask and through an IV tube in one of your veins.  You may receive medicine to help you relax (sedative).  As soon as you are asleep, a breathing tube may be used to help you breathe.  An anesthesia specialist will stay with you throughout the procedure. He or she will help keep you comfortable and safe by continuing to give you medicines and adjusting the amount of medicine that you get. He or she will also watch your blood pressure, pulse, and oxygen levels to make sure that the anesthetics do not cause any problems.  If a breathing tube was used to help you breathe, it will be removed before you wake up. The procedure may vary among health care providers and hospitals. What happens after the procedure?  You will wake up, often slowly, after the procedure is complete, usually in a recovery area.  Your blood pressure, heart rate, breathing rate, and blood oxygen level will be monitored until the medicines you were given have worn off.  You may be given medicine to help you calm down if you feel anxious or agitated.  If you will be going home the same day, your health care provider may check to make sure you can stand, drink, and urinate.  Your health care providers will treat your pain and side effects before you go home.  Do not drive for 24 hours if you received a sedative.  You may: ? Feel nauseous and vomit. ? Have a sore throat. ? Have mental slowness. ? Feel cold or shivery. ? Feel sleepy. ? Feel tired. ? Feel sore or achy, even in parts of your body where you did not have surgery. This information is not intended to replace advice given to you by your health care provider.  Make sure you discuss any questions you have with your health care provider. Document Released: 06/10/2007 Document Revised: 08/14/2015 Document Reviewed: 02/15/2015 Elsevier Interactive Patient Education  2018 [place] Anesthesia, Adult, Care After These instructions provide you with  information about caring for yourself after your procedure. Your health care provider may also give you more specific instructions. Your treatment has been planned according to current medical practices, but problems sometimes occur. Call your health care provider if you have any problems or questions after your procedure. What can I expect after the procedure? After the procedure, it is common to have:  Vomiting.  A sore throat.  Mental slowness.  It is common to feel:  Nauseous.  Cold or shivery.  Sleepy.  Tired.  Sore or achy, even in parts of your body where you did not have surgery.  Follow these instructions at home: For at least 24 hours after the procedure:  Do not: ? Participate in activities where you could fall or become injured. ? Drive. ? Use heavy machinery. ? Drink alcohol. ? Take sleeping pills or medicines that cause drowsiness. ? Make important decisions or sign legal documents. ? Take care of children on your own.  Rest. Eating and drinking  If you vomit, drink water, juice, or soup when you can drink without vomiting.  Drink enough fluid to keep your urine clear or pale yellow.  Make sure you have little or no nausea before eating solid foods.  Follow the diet recommended by your health care provider. General instructions  Have a responsible adult stay with you until you are awake and alert.  Return to your normal activities as told by your health care provider. Ask your health care provider what activities are safe for you.  Take over-the-counter and prescription medicines only as told by your health care provider.  If you smoke, do not smoke without supervision.  Keep all follow-up visits as told by your health care provider. This is important. Contact a health care provider if:  You continue to have nausea or vomiting at home, and medicines are not helpful.  You cannot drink fluids or start eating again.  You cannot urinate after  8-12 hours.  You develop a skin rash.  You have fever.  You have increasing redness at the site of your procedure. Get help right away if:  You have difficulty breathing.  You have chest pain.  You have unexpected bleeding.  You feel that you are having a life-threatening or urgent problem. This information is not intended to replace advice given to you by your health care provider. Make sure you discuss any questions you have with your health care provider. Document Released: 06/09/2000 Document Revised: 08/06/2015 Document Reviewed: 02/15/2015 Elsevier Interactive Patient Education  [employer].

## 2016-10-30 ENCOUNTER — Encounter (HOSPITAL_COMMUNITY)
Admission: RE | Admit: 2016-10-30 | Discharge: 2016-10-30 | Disposition: A | Discharge status: Home / Self Care | Payer: Medicare Other | Source: Ambulatory Visit | Attending: General Surgery | Admitting: General Surgery

## 2016-10-30 ENCOUNTER — Encounter (HOSPITAL_COMMUNITY): Payer: Self-pay

## 2016-10-30 ENCOUNTER — Ambulatory Visit (HOSPITAL_COMMUNITY)
Admission: RE | Admit: 2016-10-30 | Discharge: 2016-10-30 | Disposition: A | Discharge status: Home / Self Care | Payer: Medicare Other | Source: Ambulatory Visit | Attending: General Surgery | Admitting: General Surgery

## 2016-10-30 DIAGNOSIS — R918 Other nonspecific abnormal finding of lung field: Secondary | ICD-10-CM | POA: Insufficient documentation

## 2016-10-30 DIAGNOSIS — K635 Polyp of colon: Secondary | ICD-10-CM | POA: Insufficient documentation

## 2016-10-30 DIAGNOSIS — K449 Diaphragmatic hernia without obstruction or gangrene: Secondary | ICD-10-CM | POA: Diagnosis not present

## 2016-10-30 DIAGNOSIS — Z01818 Encounter for other preprocedural examination: Secondary | ICD-10-CM | POA: Diagnosis not present

## 2016-10-30 LAB — COMPREHENSIVE METABOLIC PANEL
ALBUMIN: 3.6 g/dL (ref 3.5–5.0)
ALT: 20 U/L (ref 17–63)
ANION GAP: 9 (ref 5–15)
AST: 23 U/L (ref 15–41)
Alkaline Phosphatase: 54 U/L (ref 38–126)
BILIRUBIN TOTAL: 0.6 mg/dL (ref 0.3–1.2)
BUN: 18 mg/dL (ref 6–20)
CO2: 26 mmol/L (ref 22–32)
Calcium: 9.3 mg/dL (ref 8.9–10.3)
Chloride: 101 mmol/L (ref 101–111)
Creatinine, Ser: 0.76 mg/dL (ref 0.61–1.24)
GFR calc non Af Amer: >60 mL/min (ref >60)
GLUCOSE: 137 mg/dL — AB (ref 65–99)
POTASSIUM: 3.9 mmol/L (ref 3.5–5.1)
SODIUM: 136 mmol/L (ref 135–145)
TOTAL PROTEIN: 7.1 g/dL (ref 6.5–8.1)

## 2016-10-30 LAB — PREPARE RBC (CROSSMATCH)

## 2016-10-30 LAB — CBC WITH DIFFERENTIAL/PLATELET
BASOS PCT: 0 %
Basophils Absolute: 0 10*3/uL (ref 0.0–0.1)
EOS ABS: 0.3 10*3/uL (ref 0.0–0.7)
Eosinophils Relative: 4 %
HCT: 38.7 % — ABNORMAL LOW (ref 39.0–52.0)
Hemoglobin: 13 g/dL (ref 13.0–17.0)
Lymphocytes Relative: 34 %
Lymphs Abs: 2.1 10*3/uL (ref 0.7–4.0)
MCH: 30.9 pg (ref 26.0–34.0)
MCHC: 33.6 g/dL (ref 30.0–36.0)
MCV: 91.9 fL (ref 78.0–100.0)
MONO ABS: 0.7 10*3/uL (ref 0.1–1.0)
MONOS PCT: 11 %
Neutro Abs: 3.1 10*3/uL (ref 1.7–7.7)
Neutrophils Relative %: 51 %
Platelets: 247 10*3/uL (ref 150–400)
RBC: 4.21 MIL/uL — ABNORMAL LOW (ref 4.22–5.81)
RDW: 13.8 % (ref 11.5–15.5)
WBC: 6.2 10*3/uL (ref 4.0–10.5)

## 2016-10-30 LAB — ABO/RH: ABO/RH(D): O POS

## 2016-10-31 LAB — CEA: CEA1: 1.8 ng/mL (ref 0.0–4.7)

## 2016-11-03 ENCOUNTER — Encounter (HOSPITAL_COMMUNITY): Payer: Self-pay | Admitting: *Deleted

## 2016-11-03 ENCOUNTER — Inpatient Hospital Stay (HOSPITAL_COMMUNITY): Payer: Medicare Other | Admitting: Anesthesiology

## 2016-11-03 ENCOUNTER — Encounter (HOSPITAL_COMMUNITY)
Admission: RE | Disposition: A | Discharge status: Home / Self Care | Payer: Self-pay | Source: Ambulatory Visit | Attending: General Surgery

## 2016-11-03 ENCOUNTER — Inpatient Hospital Stay (HOSPITAL_COMMUNITY)
Admission: RE | Admit: 2016-11-03 | Discharge: 2016-11-06 | DRG: 331 | Disposition: A | Discharge status: Home / Self Care | Payer: Medicare Other | Source: Ambulatory Visit | Attending: General Surgery | Admitting: General Surgery

## 2016-11-03 DIAGNOSIS — K635 Polyp of colon: Secondary | ICD-10-CM | POA: Diagnosis not present

## 2016-11-03 DIAGNOSIS — I10 Essential (primary) hypertension: Secondary | ICD-10-CM | POA: Diagnosis present

## 2016-11-03 DIAGNOSIS — Z800 Family history of malignant neoplasm of digestive organs: Secondary | ICD-10-CM | POA: Diagnosis not present

## 2016-11-03 DIAGNOSIS — Z87891 Personal history of nicotine dependence: Secondary | ICD-10-CM

## 2016-11-03 DIAGNOSIS — J45909 Unspecified asthma, uncomplicated: Secondary | ICD-10-CM | POA: Diagnosis present

## 2016-11-03 DIAGNOSIS — H409 Unspecified glaucoma: Secondary | ICD-10-CM | POA: Diagnosis present

## 2016-11-03 DIAGNOSIS — Z9049 Acquired absence of other specified parts of digestive tract: Secondary | ICD-10-CM

## 2016-11-03 DIAGNOSIS — D120 Benign neoplasm of cecum: Principal | ICD-10-CM | POA: Diagnosis present

## 2016-11-03 DIAGNOSIS — Z87442 Personal history of urinary calculi: Secondary | ICD-10-CM

## 2016-11-03 HISTORY — PX: PARTIAL COLECTOMY: SHX5273

## 2016-11-03 LAB — GLUCOSE, CAPILLARY: Glucose-Capillary: 154 mg/dL — ABNORMAL HIGH (ref 65–99)

## 2016-11-03 SURGERY — COLECTOMY, PARTIAL
Anesthesia: General | Site: Abdomen

## 2016-11-03 MED ORDER — 0.9 % SODIUM CHLORIDE (POUR BTL) OPTIME
TOPICAL | Status: DC | PRN
  Administered 2016-11-03: 1000 mL
  Administered 2016-11-03: 2000 mL

## 2016-11-03 MED ORDER — HEMOSTATIC AGENTS (NO CHARGE) OPTIME
TOPICAL | Status: DC | PRN
  Administered 2016-11-03: 1 via TOPICAL

## 2016-11-03 MED ORDER — DIPHENHYDRAMINE HCL 12.5 MG/5ML PO ELIX: 12.5000 mg | ORAL_SOLUTION | Freq: Four times a day (QID) | ORAL | Status: DC | PRN

## 2016-11-03 MED ORDER — NEOSTIGMINE METHYLSULFATE 10 MG/10ML IV SOLN
INTRAVENOUS | Status: DC | PRN
  Administered 2016-11-03 (×2): 2 mg via INTRAVENOUS

## 2016-11-03 MED ORDER — GLYCOPYRROLATE 0.2 MG/ML IJ SOLN
INTRAMUSCULAR | Status: DC | PRN
  Administered 2016-11-03: 0.6 mg via INTRAVENOUS

## 2016-11-03 MED ORDER — OXYCODONE HCL 5 MG/5ML PO SOLN: 5.0000 mg | Freq: Once | ORAL | Status: AC | PRN

## 2016-11-03 MED ORDER — HYDROCHLOROTHIAZIDE 25 MG PO TABS
25.0000 mg | ORAL_TABLET | Freq: Every day | ORAL | Status: DC
  Administered 2016-11-04 – 2016-11-05 (×2): 25 mg via ORAL
  Filled 2016-11-03 (×2): qty 1

## 2016-11-03 MED ORDER — ROCURONIUM BROMIDE 100 MG/10ML IV SOLN
INTRAVENOUS | Status: DC | PRN
  Administered 2016-11-03: 5 mg via INTRAVENOUS
  Administered 2016-11-03: 45 mg via INTRAVENOUS

## 2016-11-03 MED ORDER — ALVIMOPAN 12 MG PO CAPS
12.0000 mg | ORAL_CAPSULE | ORAL | Status: AC
  Administered 2016-11-03: 12 mg via ORAL

## 2016-11-03 MED ORDER — SIMETHICONE 80 MG PO CHEW
40.0000 mg | CHEWABLE_TABLET | Freq: Four times a day (QID) | ORAL | Status: DC | PRN
  Administered 2016-11-04: 40 mg via ORAL
  Filled 2016-11-03: qty 1

## 2016-11-03 MED ORDER — LATANOPROST 0.005 % OP SOLN
1.0000 [drp] | Freq: Every day | OPHTHALMIC | Status: DC
  Administered 2016-11-03 – 2016-11-05 (×3): 1 [drp] via OPHTHALMIC
  Filled 2016-11-03: qty 2.5

## 2016-11-03 MED ORDER — FENTANYL CITRATE (PF) 100 MCG/2ML IJ SOLN
INTRAMUSCULAR | Status: DC | PRN
  Administered 2016-11-03 (×2): 50 ug via INTRAVENOUS

## 2016-11-03 MED ORDER — OLMESARTAN MEDOXOMIL-HCTZ 40-25 MG PO TABS: 0.5000 | ORAL_TABLET | Freq: Every day | ORAL | Status: DC

## 2016-11-03 MED ORDER — ENOXAPARIN SODIUM 40 MG/0.4ML ~~LOC~~ SOLN
40.0000 mg | SUBCUTANEOUS | Status: DC
  Administered 2016-11-04 – 2016-11-06 (×3): 40 mg via SUBCUTANEOUS
  Filled 2016-11-03 (×3): qty 0.4

## 2016-11-03 MED ORDER — IRBESARTAN 300 MG PO TABS
300.0000 mg | ORAL_TABLET | Freq: Every day | ORAL | Status: DC
  Administered 2016-11-04 – 2016-11-05 (×2): 300 mg via ORAL
  Filled 2016-11-03 (×2): qty 1

## 2016-11-03 MED ORDER — ROCURONIUM BROMIDE 50 MG/5ML IV SOLN
INTRAVENOUS | Status: AC
  Filled 2016-11-03: qty 1

## 2016-11-03 MED ORDER — LIDOCAINE HCL (PF) 1 % IJ SOLN
INTRAMUSCULAR | Status: AC
  Filled 2016-11-03: qty 5

## 2016-11-03 MED ORDER — FENTANYL CITRATE (PF) 250 MCG/5ML IJ SOLN
INTRAMUSCULAR | Status: AC
  Filled 2016-11-03: qty 10

## 2016-11-03 MED ORDER — ACETAMINOPHEN 325 MG PO TABS
ORAL_TABLET | ORAL | Status: AC
  Filled 2016-11-03: qty 2

## 2016-11-03 MED ORDER — PROPOFOL 10 MG/ML IV BOLUS
INTRAVENOUS | Status: DC | PRN
  Administered 2016-11-03: 130 mg via INTRAVENOUS

## 2016-11-03 MED ORDER — CHLORHEXIDINE GLUCONATE CLOTH 2 % EX PADS: 6.0000 | MEDICATED_PAD | Freq: Once | CUTANEOUS | Status: DC

## 2016-11-03 MED ORDER — LIDOCAINE HCL 1 % IJ SOLN
INTRAMUSCULAR | Status: DC | PRN
  Administered 2016-11-03: 30 mg via INTRADERMAL

## 2016-11-03 MED ORDER — BUPIVACAINE LIPOSOME 1.3 % IJ SUSP
INTRAMUSCULAR | Status: DC | PRN
  Administered 2016-11-03: 20 mL

## 2016-11-03 MED ORDER — FENTANYL CITRATE (PF) 100 MCG/2ML IJ SOLN
25.0000 ug | INTRAMUSCULAR | Status: DC | PRN
  Administered 2016-11-03 (×2): 25 ug via INTRAVENOUS
  Filled 2016-11-03: qty 2

## 2016-11-03 MED ORDER — LACTATED RINGERS IV SOLN
INTRAVENOUS | Status: DC
Start: 2016-11-03 — End: 2016-11-03
  Administered 2016-11-03 (×2): via INTRAVENOUS

## 2016-11-03 MED ORDER — EPHEDRINE SULFATE 50 MG/ML IJ SOLN
INTRAMUSCULAR | Status: DC | PRN
  Administered 2016-11-03 (×2): 5 mg via INTRAVENOUS
  Administered 2016-11-03: 10 mg via INTRAVENOUS

## 2016-11-03 MED ORDER — MIDAZOLAM HCL 2 MG/2ML IJ SOLN
INTRAMUSCULAR | Status: AC
  Filled 2016-11-03: qty 2

## 2016-11-03 MED ORDER — DEXAMETHASONE SODIUM PHOSPHATE 4 MG/ML IJ SOLN
INTRAMUSCULAR | Status: DC | PRN
  Administered 2016-11-03: 4 mg via INTRAVENOUS

## 2016-11-03 MED ORDER — KETOROLAC TROMETHAMINE 30 MG/ML IJ SOLN
30.0000 mg | Freq: Once | INTRAMUSCULAR | Status: AC
  Administered 2016-11-03: 30 mg via INTRAVENOUS
  Filled 2016-11-03: qty 1

## 2016-11-03 MED ORDER — PROPOFOL 10 MG/ML IV BOLUS
INTRAVENOUS | Status: AC
  Filled 2016-11-03: qty 40

## 2016-11-03 MED ORDER — ONDANSETRON HCL 4 MG/2ML IJ SOLN: 4.0000 mg | Freq: Four times a day (QID) | INTRAMUSCULAR | Status: DC | PRN

## 2016-11-03 MED ORDER — HYDROCODONE-ACETAMINOPHEN 5-325 MG PO TABS
1.0000 | ORAL_TABLET | ORAL | Status: DC | PRN
  Administered 2016-11-04: 1 via ORAL
  Filled 2016-11-03: qty 1

## 2016-11-03 MED ORDER — ALBUTEROL SULFATE (2.5 MG/3ML) 0.083% IN NEBU
2.5000 mg | INHALATION_SOLUTION | Freq: Four times a day (QID) | RESPIRATORY_TRACT | Status: DC | PRN
Start: 2016-11-03 — End: 2016-11-06

## 2016-11-03 MED ORDER — MORPHINE SULFATE (PF) 2 MG/ML IV SOLN: 2.0000 mg | INTRAVENOUS | Status: DC | PRN

## 2016-11-03 MED ORDER — ENOXAPARIN SODIUM 40 MG/0.4ML ~~LOC~~ SOLN
40.0000 mg | Freq: Once | SUBCUTANEOUS | Status: AC
  Administered 2016-11-03: 40 mg via SUBCUTANEOUS

## 2016-11-03 MED ORDER — SODIUM CHLORIDE 0.9 % IJ SOLN
INTRAMUSCULAR | Status: AC
  Filled 2016-11-03: qty 10

## 2016-11-03 MED ORDER — ACETAMINOPHEN 325 MG PO TABS
650.0000 mg | ORAL_TABLET | Freq: Four times a day (QID) | ORAL | Status: DC | PRN
  Administered 2016-11-05: 650 mg via ORAL
  Filled 2016-11-03: qty 2

## 2016-11-03 MED ORDER — DEXAMETHASONE SODIUM PHOSPHATE 4 MG/ML IJ SOLN
INTRAMUSCULAR | Status: AC
  Filled 2016-11-03: qty 1

## 2016-11-03 MED ORDER — ACETAMINOPHEN 325 MG PO TABS
650.0000 mg | ORAL_TABLET | Freq: Four times a day (QID) | ORAL | Status: DC | PRN
  Administered 2016-11-03: 650 mg via ORAL

## 2016-11-03 MED ORDER — BUPIVACAINE LIPOSOME 1.3 % IJ SUSP
INTRAMUSCULAR | Status: AC
  Filled 2016-11-03: qty 20

## 2016-11-03 MED ORDER — ERTAPENEM SODIUM 1 G IJ SOLR
1.0000 g | INTRAMUSCULAR | Status: AC
  Administered 2016-11-03: 1 g via INTRAVENOUS

## 2016-11-03 MED ORDER — DIPHENHYDRAMINE HCL 50 MG/ML IJ SOLN: 12.5000 mg | Freq: Four times a day (QID) | INTRAMUSCULAR | Status: DC | PRN

## 2016-11-03 MED ORDER — MIDAZOLAM HCL 5 MG/5ML IJ SOLN
INTRAMUSCULAR | Status: DC | PRN
  Administered 2016-11-03: 2 mg via INTRAVENOUS

## 2016-11-03 MED ORDER — METOCLOPRAMIDE HCL 5 MG/ML IJ SOLN
INTRAMUSCULAR | Status: AC
  Filled 2016-11-03: qty 2

## 2016-11-03 MED ORDER — ENOXAPARIN SODIUM 40 MG/0.4ML ~~LOC~~ SOLN
SUBCUTANEOUS | Status: AC
  Filled 2016-11-03: qty 0.4

## 2016-11-03 MED ORDER — ALVIMOPAN 12 MG PO CAPS
12.0000 mg | ORAL_CAPSULE | Freq: Two times a day (BID) | ORAL | Status: DC
  Administered 2016-11-04: 12 mg via ORAL
  Filled 2016-11-03: qty 1

## 2016-11-03 MED ORDER — SUCCINYLCHOLINE CHLORIDE 20 MG/ML IJ SOLN
INTRAMUSCULAR | Status: AC
  Filled 2016-11-03: qty 1

## 2016-11-03 MED ORDER — ONDANSETRON HCL 4 MG/2ML IJ SOLN
INTRAMUSCULAR | Status: DC | PRN
  Administered 2016-11-03: 4 mg via INTRAVENOUS

## 2016-11-03 MED ORDER — ALVIMOPAN 12 MG PO CAPS
ORAL_CAPSULE | ORAL | Status: AC
  Filled 2016-11-03: qty 1

## 2016-11-03 MED ORDER — SODIUM CHLORIDE 0.9 % IV SOLN
INTRAVENOUS | Status: AC
  Filled 2016-11-03: qty 1

## 2016-11-03 MED ORDER — LACTATED RINGERS IV SOLN
INTRAVENOUS | Status: DC
  Administered 2016-11-03 (×2): via INTRAVENOUS

## 2016-11-03 MED ORDER — METOCLOPRAMIDE HCL 5 MG/ML IJ SOLN
INTRAMUSCULAR | Status: DC | PRN
  Administered 2016-11-03: 10 mg via INTRAVENOUS

## 2016-11-03 MED ORDER — ONDANSETRON 4 MG PO TBDP: 4.0000 mg | ORAL_TABLET | Freq: Four times a day (QID) | ORAL | Status: DC | PRN

## 2016-11-03 MED ORDER — OXYCODONE HCL 5 MG PO TABS
5.0000 mg | ORAL_TABLET | Freq: Once | ORAL | Status: AC | PRN
  Administered 2016-11-03: 5 mg via ORAL
  Filled 2016-11-03: qty 1

## 2016-11-03 MED ORDER — EPHEDRINE SULFATE 50 MG/ML IJ SOLN
INTRAMUSCULAR | Status: AC
  Filled 2016-11-03: qty 1

## 2016-11-03 MED ORDER — MOMETASONE FURO-FORMOTEROL FUM 100-5 MCG/ACT IN AERO
2.0000 | INHALATION_SPRAY | Freq: Two times a day (BID) | RESPIRATORY_TRACT | Status: DC
  Administered 2016-11-03 – 2016-11-05 (×5): 2 via RESPIRATORY_TRACT
  Filled 2016-11-03: qty 8.8

## 2016-11-03 MED ORDER — MIDAZOLAM HCL 2 MG/2ML IJ SOLN
1.0000 mg | Freq: Once | INTRAMUSCULAR | Status: AC | PRN
  Administered 2016-11-03: 2 mg via INTRAVENOUS
  Filled 2016-11-03: qty 2

## 2016-11-03 MED ORDER — ONDANSETRON HCL 4 MG/2ML IJ SOLN
INTRAMUSCULAR | Status: AC
  Filled 2016-11-03: qty 2

## 2016-11-03 MED ORDER — POVIDONE-IODINE 10 % OINT PACKET
TOPICAL_OINTMENT | CUTANEOUS | Status: DC | PRN
  Administered 2016-11-03: 1 via TOPICAL

## 2016-11-03 MED ORDER — ACETAMINOPHEN 650 MG RE SUPP: 650.0000 mg | Freq: Four times a day (QID) | RECTAL | Status: DC | PRN

## 2016-11-03 MED ORDER — POVIDONE-IODINE 10 % EX OINT
TOPICAL_OINTMENT | CUTANEOUS | Status: AC
  Filled 2016-11-03: qty 1

## 2016-11-03 SURGICAL SUPPLY — 67 items
APPLIER CLIP 11 MED OPEN (CLIP)
APPLIER CLIP 13 LRG OPEN (CLIP)
BAG HAMPER (MISCELLANEOUS) ×3 IMPLANT
BARRIER SKIN 2 3/4 (OSTOMY) IMPLANT
BARRIER SKIN 2 3/4 INCH (OSTOMY)
CHLORAPREP W/TINT 26ML (MISCELLANEOUS) ×3 IMPLANT
CLAMP POUCH DRAINAGE QUIET (OSTOMY) IMPLANT
CLIP APPLIE 11 MED OPEN (CLIP) IMPLANT
CLIP APPLIE 13 LRG OPEN (CLIP) IMPLANT
CLOTH BEACON ORANGE TIMEOUT ST (SAFETY) ×3 IMPLANT
COVER LIGHT HANDLE STERIS (MISCELLANEOUS) ×6 IMPLANT
COVER MAYO STAND XLG (DRAPE) ×6 IMPLANT
DRAPE UTILITY W/TAPE 26X15 (DRAPES) ×6 IMPLANT
DRAPE WARM FLUID 44X44 (DRAPE) ×3 IMPLANT
DRSG OPSITE POSTOP 4X10 (GAUZE/BANDAGES/DRESSINGS) IMPLANT
DRSG OPSITE POSTOP 4X8 (GAUZE/BANDAGES/DRESSINGS) ×3 IMPLANT
ELECT BLADE 6 FLAT ULTRCLN (ELECTRODE) ×3 IMPLANT
ELECT REM PT RETURN 9FT ADLT (ELECTROSURGICAL) ×3
ELECTRODE REM PT RTRN 9FT ADLT (ELECTROSURGICAL) ×1 IMPLANT
FORMALIN 10 PREFIL 480ML (MISCELLANEOUS) IMPLANT
GLOVE BIOGEL PI IND STRL 6.5 (GLOVE) ×2 IMPLANT
GLOVE BIOGEL PI IND STRL 7.0 (GLOVE) ×5 IMPLANT
GLOVE BIOGEL PI INDICATOR 6.5 (GLOVE) ×4
GLOVE BIOGEL PI INDICATOR 7.0 (GLOVE) ×10
GLOVE SURG SS PI 7.5 STRL IVOR (GLOVE) ×6 IMPLANT
GOWN STRL REUS W/ TWL XL LVL3 (GOWN DISPOSABLE) ×2 IMPLANT
GOWN STRL REUS W/TWL LRG LVL3 (GOWN DISPOSABLE) ×12 IMPLANT
GOWN STRL REUS W/TWL XL LVL3 (GOWN DISPOSABLE) ×4
HANDLE SUCTION POOLE (INSTRUMENTS) ×1 IMPLANT
HEMOSTAT SURGICEL 4X8 (HEMOSTASIS) ×3 IMPLANT
INST SET MAJOR GENERAL (KITS) ×3 IMPLANT
KIT BLADEGUARD II DBL (SET/KITS/TRAYS/PACK) ×3 IMPLANT
KIT ROOM TURNOVER APOR (KITS) ×3 IMPLANT
MANIFOLD NEPTUNE II (INSTRUMENTS) ×3 IMPLANT
NEEDLE HYPO 18GX1.5 BLUNT FILL (NEEDLE) ×3 IMPLANT
NEEDLE HYPO 21X1.5 SAFETY (NEEDLE) ×3 IMPLANT
NS IRRIG 1000ML POUR BTL (IV SOLUTION) ×9 IMPLANT
PACK ABDOMINAL MAJOR (CUSTOM PROCEDURE TRAY) ×3 IMPLANT
PAD ARMBOARD 7.5X6 YLW CONV (MISCELLANEOUS) ×3 IMPLANT
PENCIL HANDSWITCHING (ELECTRODE) ×3 IMPLANT
POUCH OSTOMY 2 3/4  H 3804 (WOUND CARE)
POUCH OSTOMY 2 PC DRNBL 2.25 (WOUND CARE) IMPLANT
POUCH OSTOMY 2 PC DRNBL 2.75 (WOUND CARE) IMPLANT
POUCH OSTOMY DRNBL 2 1/4 (WOUND CARE)
RELOAD LINEAR CUT PROX 55 BLUE (ENDOMECHANICALS) IMPLANT
RELOAD PROXIMATE 75MM BLUE (ENDOMECHANICALS) ×6 IMPLANT
RETRACTOR WND ALEXIS 25 LRG (MISCELLANEOUS) ×1 IMPLANT
RTRCTR WOUND ALEXIS 25CM LRG (MISCELLANEOUS) ×3
SET BASIN LINEN APH (SET/KITS/TRAYS/PACK) ×3 IMPLANT
SPONGE LAP 18X18 X RAY DECT (DISPOSABLE) ×3 IMPLANT
SPONGE SURGIFOAM ABS GEL 100 (HEMOSTASIS) ×3 IMPLANT
STAPLER GUN LINEAR PROX 60 (STAPLE) ×3 IMPLANT
STAPLER PROXIMATE 75MM BLUE (STAPLE) ×3 IMPLANT
STAPLER VISISTAT (STAPLE) ×3 IMPLANT
SUCTION POOLE HANDLE (INSTRUMENTS) ×3
SUCTION YANKAUER HANDLE (MISCELLANEOUS) ×3 IMPLANT
SUT CHROMIC 0 SH (SUTURE) IMPLANT
SUT CHROMIC 2 0 SH (SUTURE) IMPLANT
SUT CHROMIC 3 0 SH 27 (SUTURE) IMPLANT
SUT NOVA NAB GS-26 0 60 (SUTURE) ×6 IMPLANT
SUT PDS AB 0 CTX 60 (SUTURE) IMPLANT
SUT SILK 2 0 (SUTURE)
SUT SILK 2-0 18XBRD TIE 12 (SUTURE) IMPLANT
SUT SILK 3 0 SH CR/8 (SUTURE) ×6 IMPLANT
SYR 20CC LL (SYRINGE) ×3 IMPLANT
TOWEL OR 17X26 4PK STRL BLUE (TOWEL DISPOSABLE) ×3 IMPLANT
TRAY FOLEY CATH SILVER 16FR (SET/KITS/TRAYS/PACK) ×3 IMPLANT

## 2016-11-03 NOTE — Anesthesia Procedure Notes (Signed)
Procedure Name: Intubation Date/Time: 11/03/2016 7:43 AM Performed by: Charmaine Downs Pre-anesthesia Checklist: Patient identified, Patient being monitored, Timeout performed, Emergency Drugs available and Suction available Patient Re-evaluated:Patient Re-evaluated prior to induction Oxygen Delivery Method: Circle System Utilized Preoxygenation: Pre-oxygenation with 100% oxygen Induction Type: IV induction and Cricoid Pressure applied Ventilation: Mask ventilation without difficulty and Oral airway inserted - appropriate to patient size Laryngoscope Size: Mac and 4 Grade View: Grade II Tube type: Oral Tube size: 8.0 mm Number of attempts: 1 Airway Equipment and Method: stylet and Stylet Placement Confirmation: ETT inserted through vocal cords under direct vision,  positive ETCO2 and breath sounds checked- equal and bilateral Secured at: 24 cm Tube secured with: Tape Dental Injury: Teeth and Oropharynx as per pre-operative assessment

## 2016-11-03 NOTE — Anesthesia Preprocedure Evaluation (Signed)
Anesthesia Evaluation  Patient identified by MRN, date of birth, ID band Patient awake    Airway        Dental   Pulmonary asthma , former smoker,           Cardiovascular hypertension,      Neuro/Psych    GI/Hepatic   Endo/Other    Renal/GU stones     Musculoskeletal   Abdominal   Peds  Hematology   Anesthesia Other Findings   Reproductive/Obstetrics                             Anesthesia Physical Anesthesia Plan Anesthesia Quick Evaluation

## 2016-11-03 NOTE — Anesthesia Preprocedure Evaluation (Signed)
Anesthesia Evaluation  Patient identified by MRN, date of birth, ID band  Airway Mallampati: I  TM Distance: >3 FB Neck ROM: Full    Dental  (+) Poor Dentition, Missing   Pulmonary asthma (Stable with inhalers) , former smoker (many years ago),    Pulmonary exam normal breath sounds clear to auscultation       Cardiovascular hypertension, Normal cardiovascular exam Rhythm:Regular Rate:Normal     Neuro/Psych    GI/Hepatic negative GI ROS, Neg liver ROS,   Endo/Other  negative endocrine ROS  Renal/GU      Musculoskeletal   Abdominal Normal abdominal exam  (+)   Peds  Hematology  (+) anemia ,   Anesthesia Other Findings   Reproductive/Obstetrics                             Anesthesia Physical Anesthesia Plan  ASA: III  Anesthesia Plan: General   Post-op Pain Management:    Induction: Intravenous  PONV Risk Score and Plan:   Airway Management Planned: Oral ETT  Additional Equipment:   Intra-op Plan:   Post-operative Plan: Extubation in OR  Informed Consent: I have reviewed the patients History and Physical, chart, labs and discussed the procedure including the risks, benefits and alternatives for the proposed anesthesia with the patient or authorized representative who has indicated his/her understanding and acceptance.     Plan Discussed with: CRNA  Anesthesia Plan Comments:         Anesthesia Quick Evaluation

## 2016-11-03 NOTE — Anesthesia Preprocedure Evaluation (Signed)
Anesthesia Evaluation  Patient identified by MRN, date of birth, ID band  Airway        Dental   Pulmonary asthma , former smoker,           Cardiovascular hypertension, Pt. on medications      Neuro/Psych    GI/Hepatic   Endo/Other    Renal/GU Stones      Musculoskeletal   Abdominal   Peds  Hematology   Anesthesia Other Findings   Reproductive/Obstetrics                             Anesthesia Physical Anesthesia Plan Anesthesia Quick Evaluation

## 2016-11-03 NOTE — Op Note (Signed)
Patient:  Mark Harris  DOB:  04-09-1940  MRN:  [id number]   Preop Diagnosis:  Dysplasia of colon polyp  Postop Diagnosis:  Same  Procedure:  Partial colectomy  Surgeon:  Aviva Signs, M.D.  Anes:  Gen. endotracheal  Indications:  Patient is a 76 year old white male who was found on colonoscopy to have a dysplastic polyp in the cecum. He now presents for a partial colectomy. The risks and benefits of the procedure including bleeding, infection, cardiopulmonary difficulties, and the possibility of anastomotic leak were fully explained to the patient, who gave informed consent.  Procedure note:  The patient was placed in the supine position. After induction of general endotracheal anesthesia, the abdomen was prepped and draped using the usual sterile technique with ChloraPrep. Surgical site confirmation was performed.  A midline incision was made from above the umbilicus to below the umbilicus. The peritoneal cavity was entered into without difficulty. The liver was palpated and no abnormal lesions were noted. On palpation of the colon, the mass was noted to be at the base the cecum. The right colon was mobilized along its peritoneal reflection. The hepatic flexure was taken down using the Enseal. Care was taken to avoid the right ureter. A GIA stapler was placed across the terminal ileum and fired. This was likewise done across the proximal transverse colon. Mesentery was then divided using the Enseal. The right colon was removed and sent to pathology for examination. A side to side terminal ileum to transverse colon anastomosis was performed using a 75 GIA stapler. The enterotomy was closed using a TA 60 stapler. The staple line was bolstered using 3-0 silk sutures. Surrounding greater omentum was placed over the anastomosis and secured in place using 3-0 silk sutures. The mesenteric defect was closed using 3-0 silk interrupted sutures. The bowel was returned into the abdominal cavity and  orderly fashion. The abdominal cavity was copiously irrigated with normal saline. Surgicel and Gelfoam were then placed on the right pericolic gutter for hemostasis purposes. All operating personnel been changed her gown and gloves. A new setup was used for closure. The fascia was reapproximated using a looped 0 Novafil running suture. Subcutaneous layer was irrigated with normal saline. Exparel was instilled into the surrounding wound. The skin was closed using staples. Betadine ointment and a dry sterile dressing were applied.  All tape and needle counts were correct at the end of the procedure. The patient was extubated in the operating room and transferred to PACU in stable condition.  Complications:  None  EBL:  100 mL  Specimen:  Right colon

## 2016-11-03 NOTE — Anesthesia Postprocedure Evaluation (Signed)
Anesthesia Post Note  Patient: Mark Harris  Procedure(s) Performed: Procedure(s) (LRB): PARTIAL COLECTOMY (N/A)  Anesthesia Post Evaluation   Last Vitals:  Vitals:   11/03/16 0700  BP: 136/63  Pulse: 66  Resp: 20  Temp: 36.6 C  SpO2: 95%    Last Pain:  Vitals:   11/03/16 0700  TempSrc: Oral                 Tawana Pasch J

## 2016-11-03 NOTE — Anesthesia Preprocedure Evaluation (Addendum)
Anesthesia Evaluation    Airway        Dental   Pulmonary former smoker,           Cardiovascular hypertension,      Neuro/Psych    GI/Hepatic   Endo/Other    Renal/GU      Musculoskeletal   Abdominal   Peds  Hematology   Anesthesia Other Findings   Reproductive/Obstetrics                             Anesthesia Physical Anesthesia Plan  ASA: II  Anesthesia Plan:    Post-op Pain Management:    Induction:   PONV Risk Score and Plan:   Airway Management Planned:   Additional Equipment:   Intra-op Plan:   Post-operative Plan:   Informed Consent:   Plan Discussed with:   Anesthesia Plan Comments:         Anesthesia Quick Evaluation

## 2016-11-03 NOTE — Transfer of Care (Signed)
Immediate Anesthesia Transfer of Care Note  Patient: Mark Harris  Procedure(s) Performed: Procedure(s): PARTIAL COLECTOMY (N/A)  Patient Location: PACU  Anesthesia Type:General  Level of Consciousness: awake and patient cooperative  Airway & Oxygen Therapy: Patient Spontanous Breathing and Patient connected to face mask oxygen  Post-op Assessment: Report given to RN, Post -op Vital signs reviewed and stable and Patient moving all extremities  Post vital signs: Reviewed and stable  Last Vitals:  Vitals:   11/03/16 0700  BP: 136/63  Pulse: 66  Resp: 20  Temp: 36.6 C  SpO2: 95%    Last Pain:  Vitals:   11/03/16 0700  TempSrc: Oral         Complications: No apparent anesthesia complications

## 2016-11-03 NOTE — Anesthesia Postprocedure Evaluation (Signed)
Anesthesia Post Note  Patient: Mark Harris  Procedure(s) Performed: Procedure(s) (LRB): PARTIAL COLECTOMY (N/A)  Patient location during evaluation: PACU Anesthesia Type: General Level of consciousness: awake and patient cooperative Pain management: pain level controlled Vital Signs Assessment: post-procedure vital signs reviewed and stable Respiratory status: spontaneous breathing, nonlabored ventilation and respiratory function stable Cardiovascular status: blood pressure returned to baseline Postop Assessment: no signs of nausea or vomiting Anesthetic complications: no     Last Vitals:  Vitals:   11/03/16 0940 11/03/16 0945  BP:  (!) 138/56  Pulse: 67 66  Resp: 20 15  Temp:    SpO2: 94% 94%    Last Pain:  Vitals:   11/03/16 0945  TempSrc:   PainSc: Asleep                 Seyon Strader J

## 2016-11-03 NOTE — Interval H&P Note (Signed)
History and Physical Interval Note:  11/03/2016 7:15 AM  Mark Harris  has presented today for surgery, with the diagnosis of dysplastic colon polyps  The various methods of treatment have been discussed with the patient and family. After consideration of risks, benefits and other options for treatment, the patient has consented to  Procedure(s): PARTIAL COLECTOMY (N/A) as a surgical intervention .  The patient's history has been reviewed, patient examined, no change in status, stable for surgery.  I have reviewed the patient's chart and labs.  Questions were answered to the patient's satisfaction.     Aviva Signs

## 2016-11-04 LAB — BASIC METABOLIC PANEL
ANION GAP: 7 (ref 5–15)
BUN: 14 mg/dL (ref 6–20)
CALCIUM: 8.2 mg/dL — AB (ref 8.9–10.3)
CHLORIDE: 101 mmol/L (ref 101–111)
CO2: 28 mmol/L (ref 22–32)
Creatinine, Ser: 0.88 mg/dL (ref 0.61–1.24)
GFR calc non Af Amer: >60 mL/min (ref >60)
Glucose, Bld: 135 mg/dL — ABNORMAL HIGH (ref 65–99)
POTASSIUM: 3.6 mmol/L (ref 3.5–5.1)
SODIUM: 136 mmol/L (ref 135–145)

## 2016-11-04 LAB — CBC
HEMATOCRIT: 36.6 % — AB (ref 39.0–52.0)
HEMOGLOBIN: 12.4 g/dL — AB (ref 13.0–17.0)
MCH: 31.3 pg (ref 26.0–34.0)
MCHC: 33.9 g/dL (ref 30.0–36.0)
MCV: 92.4 fL (ref 78.0–100.0)
Platelets: 224 10*3/uL (ref 150–400)
RBC: 3.96 MIL/uL — ABNORMAL LOW (ref 4.22–5.81)
RDW: 13.8 % (ref 11.5–15.5)
WBC: 11.4 10*3/uL — ABNORMAL HIGH (ref 4.0–10.5)

## 2016-11-04 LAB — MAGNESIUM: Magnesium: 1.5 mg/dL — ABNORMAL LOW (ref 1.7–2.4)

## 2016-11-04 LAB — PHOSPHORUS: PHOSPHORUS: 2.6 mg/dL (ref 2.5–4.6)

## 2016-11-04 MED ORDER — MAGNESIUM SULFATE 2 GM/50ML IV SOLN
2.0000 g | Freq: Once | INTRAVENOUS | Status: AC
  Administered 2016-11-04: 2 g via INTRAVENOUS
  Filled 2016-11-04: qty 50

## 2016-11-04 MED ORDER — KCL IN DEXTROSE-NACL 20-5-0.45 MEQ/L-%-% IV SOLN
INTRAVENOUS | Status: DC
  Administered 2016-11-04 – 2016-11-05 (×2): via INTRAVENOUS

## 2016-11-04 NOTE — Addendum Note (Signed)
Addendum  created 11/04/16 0759 by Mickel Baas, CRNA   Sign clinical note

## 2016-11-04 NOTE — Anesthesia Postprocedure Evaluation (Signed)
Anesthesia Post Note  Patient: Mark Harris  Procedure(s) Performed: Procedure(s) (LRB): PARTIAL COLECTOMY (N/A)  Patient location during evaluation: Nursing Unit Anesthesia Type: General Level of consciousness: awake and alert and patient cooperative Pain management: pain level controlled Vital Signs Assessment: post-procedure vital signs reviewed and stable Respiratory status: spontaneous breathing Cardiovascular status: stable Postop Assessment: no signs of nausea or vomiting Anesthetic complications: no     Last Vitals:  Vitals:   11/03/16 2225 11/04/16 0612  BP: (!) 129/52 (!) 135/52  Pulse: 76 76  Resp: 18 18  Temp: 36.7 C 37.1 C  SpO2: 94% 95%    Last Pain:  Vitals:   11/04/16 0612  TempSrc: Oral  PainSc:                  Kasey Hansell A

## 2016-11-04 NOTE — Progress Notes (Signed)
1 Day Post-Op  Subjective: Patient feels fine. Has not required any pain medication overnight.  Objective: Vital signs in last 24 hours: Temp:  [97.6 F (36.4 C)-98.8 F (37.1 C)] 98.8 F (37.1 C) (08/21 0612) Pulse Rate:  [56-80] 76 (08/21 0612) Resp:  [14-20] 18 (08/21 0612) BP: (107-153)/(40-96) 135/52 (08/21 0612) SpO2:  [91 %-100 %] 95 % (08/21 0612) Weight:  [192 lb (87.1 kg)] 192 lb (87.1 kg) (08/20 1349) Last BM Date: 11/03/16  Intake/Output from previous day: 08/20 0701 - 08/21 0700 In: 2505 [I.V.:2505] Out: 1200 [Urine:1100; Blood:100] Intake/Output this shift: No intake/output data recorded.  General appearance: alert, cooperative and no distress Resp: clear to auscultation bilaterally Cardio: regular rate and rhythm, S1, S2 normal, no murmur, click, rub or gallop GI: Soft, incision healing well.  Lab Results:   Recent Labs  11/04/16 0520  WBC 11.4*  HGB 12.4*  HCT 36.6*  PLT 224   BMET  Recent Labs  11/04/16 0520  NA 136  K 3.6  CL 101  CO2 28  GLUCOSE 135*  BUN 14  CREATININE 0.88  CALCIUM 8.2*   PT/INR No results for input(s): LABPROT, INR in the last 72 hours.  Studies/Results: No results found.  Anti-infectives: Anti-infectives    Start     Dose/Rate Route Frequency Ordered Stop   11/03/16 0644  ertapenem (INVANZ) 1 g in sodium chloride 0.9 % 50 mL IVPB     1 g 100 mL/hr over 30 Minutes Intravenous On call to O.R. 11/03/16 4854 11/03/16 0725      Assessment/Plan: s/p Procedure(s): PARTIAL COLECTOMY Impression: Stable on postoperative day 1. Foley has been removed. Hypomagnesemia will be addressed. We'll get patient up to chair.  LOS: 1 day    Aviva Signs 11/04/2016

## 2016-11-05 ENCOUNTER — Encounter (HOSPITAL_COMMUNITY): Payer: Self-pay | Admitting: General Surgery

## 2016-11-05 LAB — CBC
HEMATOCRIT: 36 % — AB (ref 39.0–52.0)
Hemoglobin: 12.1 g/dL — ABNORMAL LOW (ref 13.0–17.0)
MCH: 31 pg (ref 26.0–34.0)
MCHC: 33.6 g/dL (ref 30.0–36.0)
MCV: 92.3 fL (ref 78.0–100.0)
PLATELETS: 234 10*3/uL (ref 150–400)
RBC: 3.9 MIL/uL — AB (ref 4.22–5.81)
RDW: 13.9 % (ref 11.5–15.5)
WBC: 11.2 10*3/uL — ABNORMAL HIGH (ref 4.0–10.5)

## 2016-11-05 LAB — BASIC METABOLIC PANEL
Anion gap: 6 (ref 5–15)
BUN: 9 mg/dL (ref 6–20)
CALCIUM: 8.3 mg/dL — AB (ref 8.9–10.3)
CO2: 28 mmol/L (ref 22–32)
Chloride: 101 mmol/L (ref 101–111)
Creatinine, Ser: 0.68 mg/dL (ref 0.61–1.24)
GFR calc Af Amer: >60 mL/min (ref >60)
GLUCOSE: 153 mg/dL — AB (ref 65–99)
Potassium: 3.6 mmol/L (ref 3.5–5.1)
Sodium: 135 mmol/L (ref 135–145)

## 2016-11-05 LAB — MAGNESIUM: Magnesium: 1.9 mg/dL (ref 1.7–2.4)

## 2016-11-05 LAB — PHOSPHORUS: Phosphorus: 2 mg/dL — ABNORMAL LOW (ref 2.5–4.6)

## 2016-11-05 NOTE — Care Management Important Message (Signed)
Important Message  Patient Details  Name: Mark Harris MRN: [id number] Date of Birth: 1940-12-30   Medicare Important Message Given:  Yes    Janaisha Tolsma, Chauncey Reading, RN 11/05/2016, 8:47 AM

## 2016-11-05 NOTE — Progress Notes (Signed)
2 Days Post-Op  Subjective: Patient feels fine. Is not having significant incisional pain. Bowel function has returned. Tolerating full liquid diet well.  Objective: Vital signs in last 24 hours: Temp:  [97.7 F (36.5 C)-98.4 F (36.9 C)] 98.1 F (36.7 C) (08/22 0526) Pulse Rate:  [66-73] 66 (08/22 0526) Resp:  [18-20] 20 (08/22 0526) BP: (110-141)/(54-93) 110/93 (08/22 0526) SpO2:  [92 %-98 %] 98 % (08/22 0823) Last BM Date: 11/05/16  Intake/Output from previous day: 08/21 0701 - 08/22 0700 In: 1855 [P.O.:780; I.V.:1075] Out: -  Intake/Output this shift: No intake/output data recorded.  General appearance: alert, cooperative and no distress Resp: clear to auscultation bilaterally Cardio: regular rate and rhythm, S1, S2 normal, no murmur, click, rub or gallop GI: Soft, incision healing well. Bowel sounds active.  Lab Results:   Recent Labs  11/04/16 0520 11/05/16 0548  WBC 11.4* 11.2*  HGB 12.4* 12.1*  HCT 36.6* 36.0*  PLT 224 234   BMET  Recent Labs  11/04/16 0520 11/05/16 0548  NA 136 135  K 3.6 3.6  CL 101 101  CO2 28 28  GLUCOSE 135* 153*  BUN 14 9  CREATININE 0.88 0.68  CALCIUM 8.2* 8.3*   PT/INR No results for input(s): LABPROT, INR in the last 72 hours.  Studies/Results: No results found.  Anti-infectives: Anti-infectives    Start     Dose/Rate Route Frequency Ordered Stop   11/03/16 0644  ertapenem (INVANZ) 1 g in sodium chloride 0.9 % 50 mL IVPB     1 g 100 mL/hr over 30 Minutes Intravenous On call to O.R. 11/03/16 6256 11/03/16 0725      Assessment/Plan: s/p Procedure(s): PARTIAL COLECTOMY Impression: Postoperative day 2. Bowel function has returned. Patient doing well. Final pathology negative for malignancy. Will advance to regular diet. Anticipate discharge in next 24-48 hours.  LOS: 2 days    Aviva Signs 11/05/2016

## 2016-11-06 NOTE — Progress Notes (Signed)
IV removed, WNL. D/C instructions given to pt and wife. Verbalized understanding. Pt to follow up with Dr. Arnoldo Morale next week. Pt wife at bedside to transport home.

## 2016-11-06 NOTE — Discharge Instructions (Signed)
Open Colectomy, Care After °This sheet gives you information about how to care for yourself after your procedure. Your health care provider may also give you more specific instructions. If you have problems or questions, contact your health care provider. °What can I expect after the procedure? °After the procedure, it is common to have: °· Pain in your abdomen, especially along your incision. °· Tiredness. Your energy level will return to normal over the next several weeks. °· Constipation. °· Nausea. °· Difficulty urinating. ° °Follow these instructions at home: °Activity °· You may be able to return to most of your normal activities within 1-2 weeks, such as working, walking up stairs, and sexual activity. °· Avoid activities that require a lot of energy for 4-6 weeks after surgery, such as running, climbing, and lifting heavy objects. Ask your health care provider what activities are safe for you. °· Take rest breaks during the day as needed. °· Do not drive for 1-2 weeks or until your health care provider says that it is safe. °· Do not drive or use heavy machinery while taking prescription pain medicines. °· Do not lift anything that is heavier than 10 lb (4.3 kg) until your health care provider says that it is safe. °Incision care °· Follow instructions from your health care provider about how to take care of your incision. Make sure you: °? Wash your hands with soap and water before you change your bandage (dressing). If soap and water are not available, use hand sanitizer. °? Change your dressing as told by your health care provider. °? Leave stitches (sutures) or staples in place. These skin closures may need to stay in place for 2 weeks or longer. °· Avoid wearing tight clothing around your incision. °· Protect your incision area from the sun. °· Check your incision area every day for signs of infection. Check for: °? More redness, swelling, or pain. °? More fluid or blood. °? Warmth. °? Pus or a bad  smell. °General instructions °· Do not take baths, swim, or use a hot tub until your health care provider approves. Ask your health care provider when you may shower. °· Take over-the-counter and prescription medicines, including stool softeners, only as told by your health care provider. °· Eat a low-fat and low-fiber diet for the first 4 weeks after surgery. °· Keep all follow-up visits as told by your health care provider. This is important. °Contact a health care provider if: °· You have more redness, swelling, or pain around your incision. °· You have more fluid or blood coming from your incision. °· Your incision feels warm to the touch. °· You have pus or a bad smell coming from your incision. °· You have a fever or chills. °· You do not have a bowel movement 2-3 days after surgery. °· You cannot eat or drink for 24 hours or more. °· You have persistent nausea and vomiting. °· You have abdominal pain that gets worse and does not get better with medicine. °Get help right away if: °· You have chest pain. °· You have shortness of breath. °· You have pain or swelling in your legs. °· Your incision breaks open after your sutures or staples have been removed. °· You have bleeding from the rectum. °This information is not intended to replace advice given to you by your health care provider. Make sure you discuss any questions you have with your health care provider. °Document Released: 09/24/2010 Document Revised: 12/03/2015 Document Reviewed: 12/03/2015 °Elsevier Interactive Patient   Education © 2018 [employer] ° °

## 2016-11-06 NOTE — Discharge Summary (Signed)
Physician Discharge Summary  Patient ID: Mark Harris MRN: [id number] DOB/AGE: 1940/11/19 76 y.o.  Admit date: 11/03/2016 Discharge date: 11/06/2016  Admission Diagnoses:Dysplastic polyp of colon  Discharge Diagnoses: Tubular adenoma of colon Active Problems:   S/P partial colectomy   Dysplastic polyp of colon   Discharged Condition: good  Hospital Course: Patient is a 76 year old white male who was found on colonoscopy to have a dysplastic polyp of the colon in the cecum. He underwent a right hemicolectomy on 11/03/2016. Tolerated the surgery well. His postoperative course was remarkable only for mild hypomagnesemia. His bowel function has returned and his diet was advanced without difficulty. Final pathology was negative for malignancy. He is being discharged home on 11/06/2016 in good and improving condition.  Treatments: surgery: Right hemicolectomy on 11/03/2016  Discharge Exam: Blood pressure (!) 143/60, pulse (!) 47, temperature 98.7 F (37.1 C), temperature source Oral, resp. rate 20, height 5\' 6"  (1.676 m), weight 192 lb (87.1 kg), SpO2 95 %. General appearance: alert, cooperative and no distress Resp: clear to auscultation bilaterally Cardio: regular rate and rhythm, S1, S2 normal, no murmur, click, rub or gallop GI: Soft, incision healing well. Bowel sounds active.  Disposition: 01-Home or Self Care  Discharge Instructions    Diet - low sodium heart healthy    Complete by:  As directed    Increase activity slowly    Complete by:  As directed      Allergies as of 11/06/2016   No Known Allergies     Medication List    TAKE these medications   albuterol 108 (90 Base) MCG/ACT inhaler Commonly known as:  PROVENTIL HFA;VENTOLIN HFA Inhale 2 puffs into the lungs every 6 (six) hours as needed for wheezing or shortness of breath (for asthma).   bimatoprost 0.01 % Soln Commonly known as:  LUMIGAN Place 1 drop into both eyes at bedtime.   metroNIDAZOLE 500 MG  tablet Commonly known as:  FLAGYL Take 1 tablet (500 mg total) by mouth 3 (three) times daily. Take one tablet po at 1pm, 2pm, and 9pm the day before surgery   neomycin 500 MG tablet Commonly known as:  MYCIFRADIN Take 2 tablets (1,000 mg total) by mouth 3 (three) times daily. Take one tablet po at 1pm, 2pm, and 9pm the day before surgery   olmesartan-hydrochlorothiazide 40-25 MG tablet Commonly known as:  BENICAR HCT Take 0.5 tablets by mouth daily with lunch.   Potassium 99 MG Tabs Take 99 mg by mouth 2 (two) times a week.   SIMBRINZA 1-0.2 % Susp Generic drug:  Brinzolamide-Brimonidine Place 1 drop into the right eye 3 (three) times daily.   SYMBICORT 80-4.5 MCG/ACT inhaler Generic drug:  budesonide-formoterol Inhale 2 puffs into the lungs daily. For breathing/respiratory issues            Discharge Care Instructions        Start     Ordered   11/06/16 0000  Diet - low sodium heart healthy     11/06/16 0758   11/06/16 0000  Increase activity slowly     11/06/16 3614     Follow-up Information    Aviva Signs, MD. Schedule an appointment as soon as possible for a visit on 11/13/2016.   Specialty:  General Surgery Contact information: [id number] [place] [address] [phone]           Signed: Aviva Signs 11/06/2016, 7:58 AM

## 2016-11-09 LAB — TYPE AND SCREEN
ABO/RH(D): O POS
ANTIBODY SCREEN: NEGATIVE
UNIT DIVISION: 0
Unit division: 0

## 2016-11-09 LAB — BPAM RBC
BLOOD PRODUCT EXPIRATION DATE: 201809132359
BLOOD PRODUCT EXPIRATION DATE: 201809212359
UNIT TYPE AND RH: 5100
Unit Type and Rh: 5100

## 2016-11-13 ENCOUNTER — Encounter: Payer: Self-pay | Admitting: General Surgery

## 2016-11-13 ENCOUNTER — Ambulatory Visit (INDEPENDENT_AMBULATORY_CARE_PROVIDER_SITE_OTHER): Payer: Self-pay | Admitting: General Surgery

## 2016-11-13 VITALS — BP 135/52 | HR 66 | Temp 97.3°F | Resp 18 | Ht 66.0 in | Wt 185.0 lb

## 2016-11-13 DIAGNOSIS — Z09 Encounter for follow-up examination after completed treatment for conditions other than malignant neoplasm: Secondary | ICD-10-CM

## 2016-11-13 NOTE — Progress Notes (Signed)
Subjective:     Mark Harris  Status post right hemicolectomy. Doing well. Has no complaints. Objective:    BP (!) 135/52   Pulse 66   Temp (!) 97.3 F (36.3 C)   Resp 18   Ht 5\' 6"  (1.676 m)   Wt 185 lb (83.9 kg)   BMI 29.86 kg/m   General:  alert, cooperative and no distress  Abdomen soft, incision healing well. Staples removed, Steri-Strips applied. Final pathology is negative for malignancy.     Assessment:    Doing well postoperatively.    Plan:   Patient can increase activity as able. Will follow-up as needed.

## 2017-01-16 DIAGNOSIS — H401113 Primary open-angle glaucoma, right eye, severe stage: Secondary | ICD-10-CM | POA: Diagnosis not present

## 2017-02-16 DIAGNOSIS — Z1389 Encounter for screening for other disorder: Secondary | ICD-10-CM | POA: Diagnosis not present

## 2017-02-16 DIAGNOSIS — Z6830 Body mass index (BMI) 30.0-30.9, adult: Secondary | ICD-10-CM | POA: Diagnosis not present

## 2017-02-16 DIAGNOSIS — E6609 Other obesity due to excess calories: Secondary | ICD-10-CM | POA: Diagnosis not present

## 2017-02-16 DIAGNOSIS — J069 Acute upper respiratory infection, unspecified: Secondary | ICD-10-CM | POA: Diagnosis not present

## 2017-03-27 DIAGNOSIS — H401113 Primary open-angle glaucoma, right eye, severe stage: Secondary | ICD-10-CM | POA: Diagnosis not present

## 2017-08-20 DIAGNOSIS — E782 Mixed hyperlipidemia: Secondary | ICD-10-CM | POA: Diagnosis not present

## 2017-08-20 DIAGNOSIS — R7309 Other abnormal glucose: Secondary | ICD-10-CM | POA: Diagnosis not present

## 2017-08-20 DIAGNOSIS — I10 Essential (primary) hypertension: Secondary | ICD-10-CM | POA: Diagnosis not present

## 2017-08-20 DIAGNOSIS — M1990 Unspecified osteoarthritis, unspecified site: Secondary | ICD-10-CM | POA: Diagnosis not present

## 2017-08-20 DIAGNOSIS — R7301 Impaired fasting glucose: Secondary | ICD-10-CM | POA: Diagnosis not present

## 2017-08-20 DIAGNOSIS — Z6831 Body mass index (BMI) 31.0-31.9, adult: Secondary | ICD-10-CM | POA: Diagnosis not present

## 2017-08-20 DIAGNOSIS — D510 Vitamin B12 deficiency anemia due to intrinsic factor deficiency: Secondary | ICD-10-CM | POA: Diagnosis not present

## 2017-08-20 DIAGNOSIS — Z1389 Encounter for screening for other disorder: Secondary | ICD-10-CM | POA: Diagnosis not present

## 2017-08-20 DIAGNOSIS — J4540 Moderate persistent asthma, uncomplicated: Secondary | ICD-10-CM | POA: Diagnosis not present

## 2017-08-20 DIAGNOSIS — Z125 Encounter for screening for malignant neoplasm of prostate: Secondary | ICD-10-CM | POA: Diagnosis not present

## 2017-08-20 DIAGNOSIS — Z0001 Encounter for general adult medical examination with abnormal findings: Secondary | ICD-10-CM | POA: Diagnosis not present

## 2017-12-12 DIAGNOSIS — Z23 Encounter for immunization: Secondary | ICD-10-CM | POA: Diagnosis not present

## 2017-12-21 DIAGNOSIS — H401113 Primary open-angle glaucoma, right eye, severe stage: Secondary | ICD-10-CM | POA: Diagnosis not present

## 2018-01-28 IMAGING — DX DG CHEST 2V
2 series · 2 of 2 positions shown · non-contrast
Comparison: None available

CLINICAL DATA: Dysplastic colonic polyp, preop evaluation

EXAM:
CHEST  2 VIEW

[chest pa]
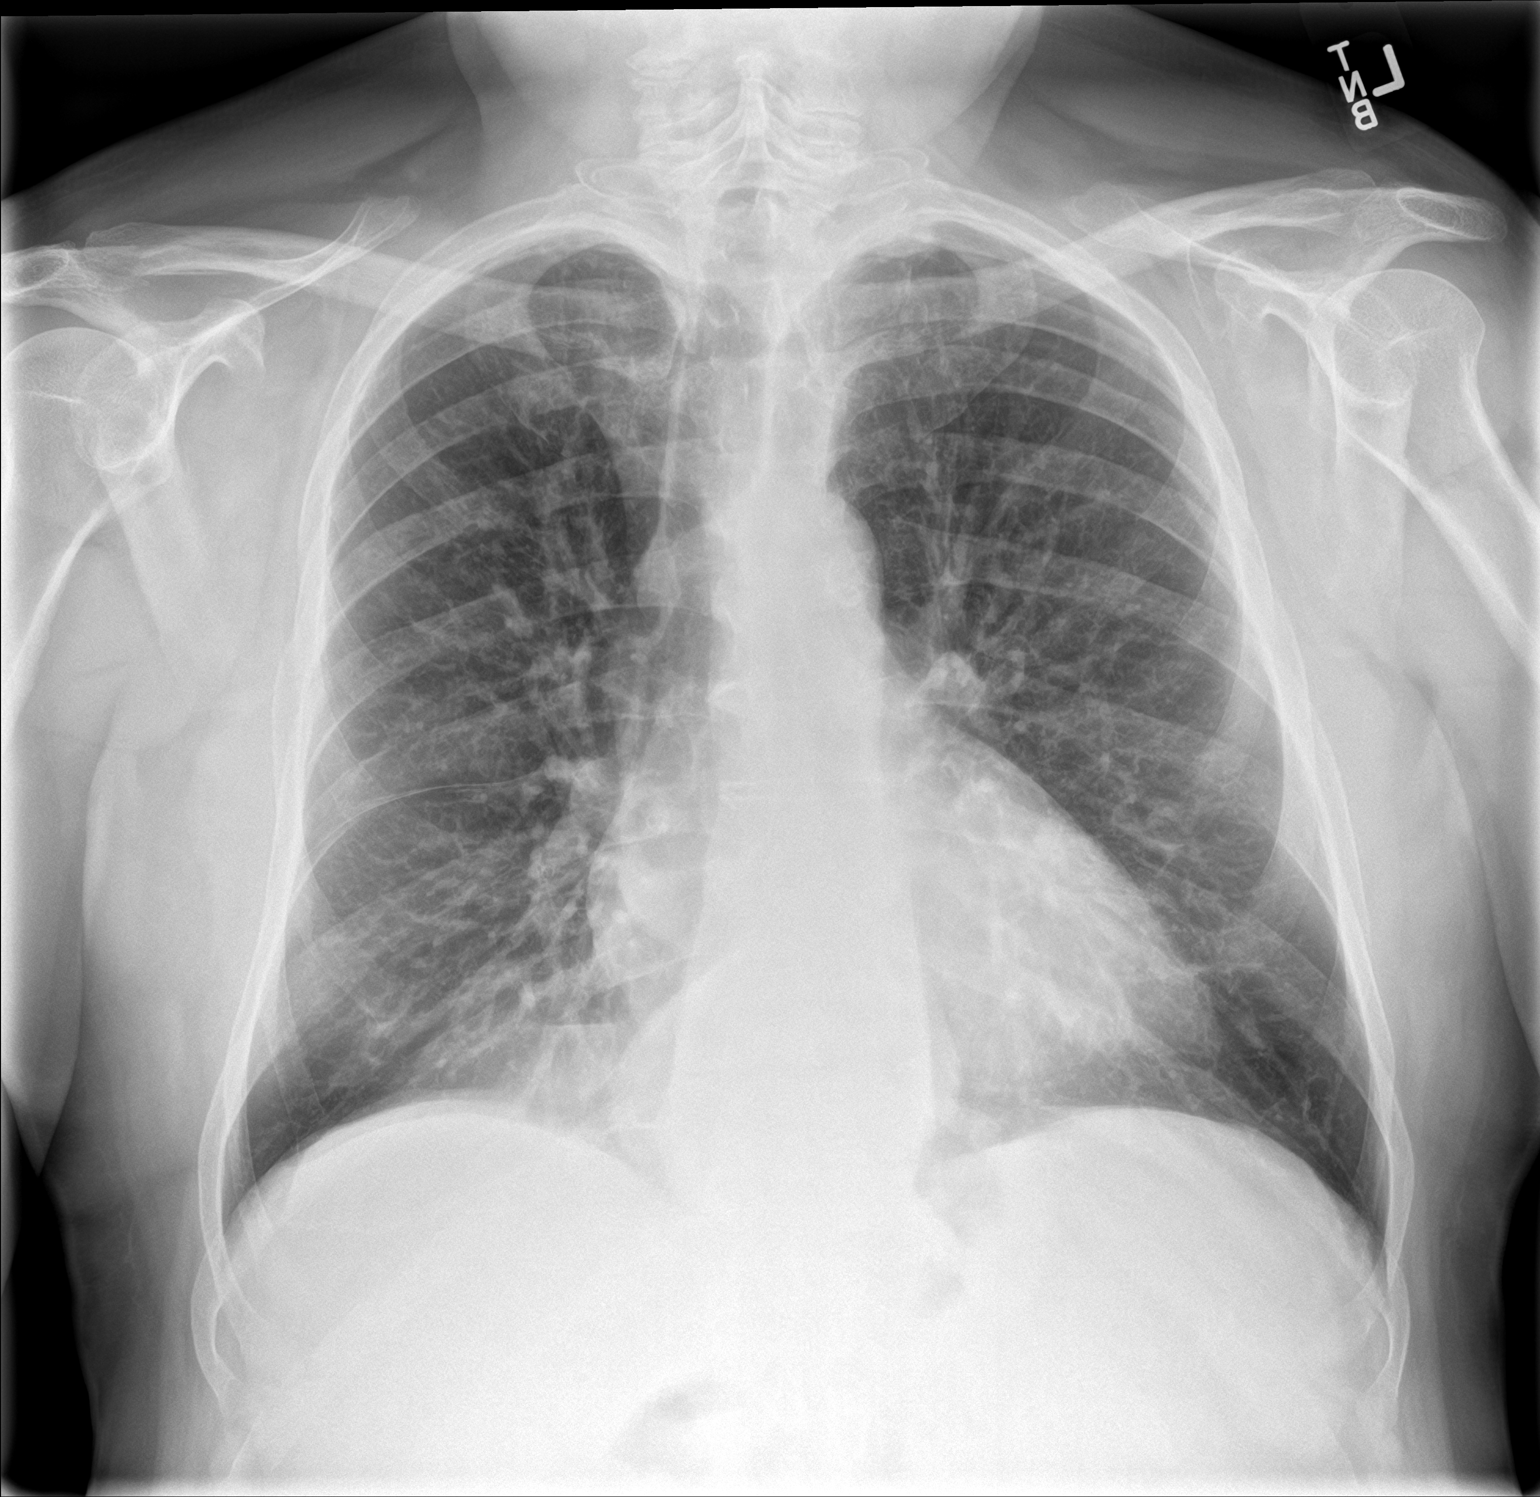

[chest lat]
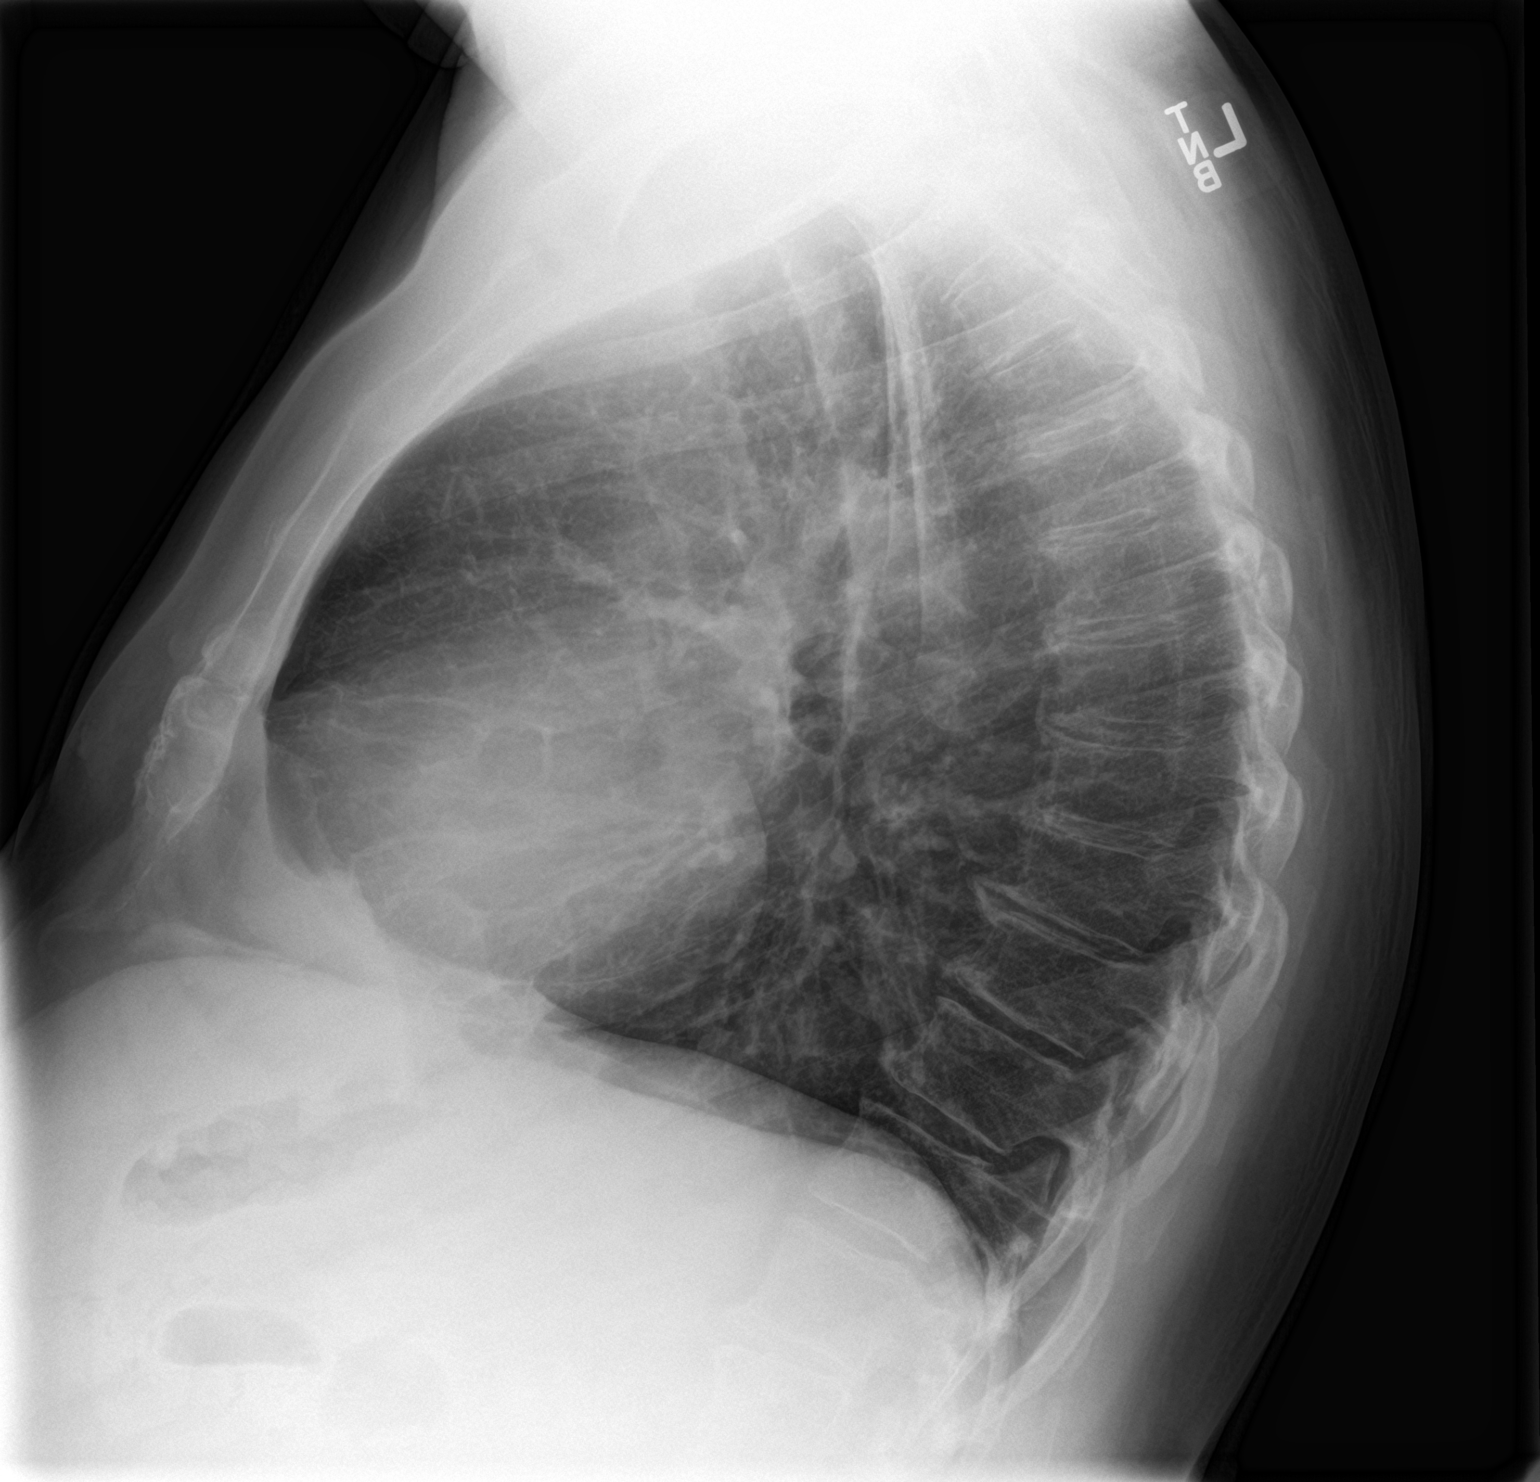

[2 of 2 positions shown; findings below may reference images not displayed]

FINDINGS: Normal heart size. Mild vascular congestion and interstitial
prominence, nonspecific. No definite focal pneumonia, collapse, or
consolidation. Negative for edema, effusion or pneumothorax. Trachea
is midline. Moderate hiatal hernia noted. Degenerative changes of
the spine. No severe compression fracture.
IMPRESSION: Mild vascular congestion and interstitial prominence, nonspecific.

Negative for CHF or pneumonia

Moderate hiatal hernia

## 2018-07-02 ENCOUNTER — Encounter (HOSPITAL_COMMUNITY): Payer: Self-pay | Admitting: Urology

## 2018-07-02 DIAGNOSIS — Z6830 Body mass index (BMI) 30.0-30.9, adult: Secondary | ICD-10-CM | POA: Diagnosis not present

## 2018-07-02 DIAGNOSIS — E6609 Other obesity due to excess calories: Secondary | ICD-10-CM | POA: Diagnosis not present

## 2018-07-02 DIAGNOSIS — J069 Acute upper respiratory infection, unspecified: Secondary | ICD-10-CM | POA: Diagnosis not present

## 2018-07-02 DIAGNOSIS — J45909 Unspecified asthma, uncomplicated: Secondary | ICD-10-CM | POA: Diagnosis not present

## 2018-07-02 DIAGNOSIS — Z1389 Encounter for screening for other disorder: Secondary | ICD-10-CM | POA: Diagnosis not present

## 2018-07-06 ENCOUNTER — Encounter (HOSPITAL_COMMUNITY): Payer: Self-pay | Admitting: Urology

## 2018-07-30 DIAGNOSIS — H401113 Primary open-angle glaucoma, right eye, severe stage: Secondary | ICD-10-CM | POA: Diagnosis not present

## 2018-09-03 DIAGNOSIS — Z1389 Encounter for screening for other disorder: Secondary | ICD-10-CM | POA: Diagnosis not present

## 2018-09-03 DIAGNOSIS — I10 Essential (primary) hypertension: Secondary | ICD-10-CM | POA: Diagnosis not present

## 2018-09-03 DIAGNOSIS — Z129 Encounter for screening for malignant neoplasm, site unspecified: Secondary | ICD-10-CM | POA: Diagnosis not present

## 2018-09-03 DIAGNOSIS — E7849 Other hyperlipidemia: Secondary | ICD-10-CM | POA: Diagnosis not present

## 2018-09-03 DIAGNOSIS — E119 Type 2 diabetes mellitus without complications: Secondary | ICD-10-CM | POA: Diagnosis not present

## 2018-09-03 DIAGNOSIS — Z6830 Body mass index (BMI) 30.0-30.9, adult: Secondary | ICD-10-CM | POA: Diagnosis not present

## 2018-09-03 DIAGNOSIS — E6609 Other obesity due to excess calories: Secondary | ICD-10-CM | POA: Diagnosis not present

## 2018-09-03 DIAGNOSIS — Z0001 Encounter for general adult medical examination with abnormal findings: Secondary | ICD-10-CM | POA: Diagnosis not present

## 2018-10-21 DIAGNOSIS — L562 Photocontact dermatitis [berloque dermatitis]: Secondary | ICD-10-CM | POA: Diagnosis not present

## 2019-01-07 DIAGNOSIS — Z23 Encounter for immunization: Secondary | ICD-10-CM | POA: Diagnosis not present

## 2019-01-20 DIAGNOSIS — H2512 Age-related nuclear cataract, left eye: Secondary | ICD-10-CM | POA: Diagnosis not present

## 2019-01-20 DIAGNOSIS — H401113 Primary open-angle glaucoma, right eye, severe stage: Secondary | ICD-10-CM | POA: Diagnosis not present

## 2019-07-21 DIAGNOSIS — H2512 Age-related nuclear cataract, left eye: Secondary | ICD-10-CM | POA: Diagnosis not present

## 2019-07-21 DIAGNOSIS — H25012 Cortical age-related cataract, left eye: Secondary | ICD-10-CM | POA: Diagnosis not present

## 2019-07-21 DIAGNOSIS — H401113 Primary open-angle glaucoma, right eye, severe stage: Secondary | ICD-10-CM | POA: Diagnosis not present

## 2020-01-12 DIAGNOSIS — Z23 Encounter for immunization: Secondary | ICD-10-CM | POA: Diagnosis not present

## 2020-01-19 DIAGNOSIS — I10 Essential (primary) hypertension: Secondary | ICD-10-CM | POA: Diagnosis not present

## 2020-01-19 DIAGNOSIS — E119 Type 2 diabetes mellitus without complications: Secondary | ICD-10-CM | POA: Diagnosis not present

## 2020-01-19 DIAGNOSIS — J4540 Moderate persistent asthma, uncomplicated: Secondary | ICD-10-CM | POA: Diagnosis not present

## 2020-01-19 DIAGNOSIS — Z0001 Encounter for general adult medical examination with abnormal findings: Secondary | ICD-10-CM | POA: Diagnosis not present

## 2020-01-19 DIAGNOSIS — Z6830 Body mass index (BMI) 30.0-30.9, adult: Secondary | ICD-10-CM | POA: Diagnosis not present

## 2020-01-19 DIAGNOSIS — Z1389 Encounter for screening for other disorder: Secondary | ICD-10-CM | POA: Diagnosis not present

## 2020-01-19 DIAGNOSIS — Z1331 Encounter for screening for depression: Secondary | ICD-10-CM | POA: Diagnosis not present

## 2020-01-19 DIAGNOSIS — E7849 Other hyperlipidemia: Secondary | ICD-10-CM | POA: Diagnosis not present

## 2020-03-22 DIAGNOSIS — Z23 Encounter for immunization: Secondary | ICD-10-CM | POA: Diagnosis not present

## 2020-06-25 DIAGNOSIS — H401113 Primary open-angle glaucoma, right eye, severe stage: Secondary | ICD-10-CM | POA: Diagnosis not present

## 2020-09-05 DIAGNOSIS — Z6829 Body mass index (BMI) 29.0-29.9, adult: Secondary | ICD-10-CM | POA: Diagnosis not present

## 2020-09-05 DIAGNOSIS — E663 Overweight: Secondary | ICD-10-CM | POA: Diagnosis not present

## 2020-09-05 DIAGNOSIS — B029 Zoster without complications: Secondary | ICD-10-CM | POA: Diagnosis not present

## 2020-11-25 DIAGNOSIS — U071 COVID-19: Secondary | ICD-10-CM | POA: Diagnosis not present

## 2020-12-31 DIAGNOSIS — H401113 Primary open-angle glaucoma, right eye, severe stage: Secondary | ICD-10-CM | POA: Diagnosis not present

## 2021-02-26 DIAGNOSIS — Z23 Encounter for immunization: Secondary | ICD-10-CM | POA: Diagnosis not present

## 2021-07-12 DIAGNOSIS — H401122 Primary open-angle glaucoma, left eye, moderate stage: Secondary | ICD-10-CM | POA: Diagnosis not present

## 2021-07-12 DIAGNOSIS — H5203 Hypermetropia, bilateral: Secondary | ICD-10-CM | POA: Diagnosis not present

## 2021-07-12 DIAGNOSIS — H25013 Cortical age-related cataract, bilateral: Secondary | ICD-10-CM | POA: Diagnosis not present

## 2021-07-12 DIAGNOSIS — H2512 Age-related nuclear cataract, left eye: Secondary | ICD-10-CM | POA: Diagnosis not present

## 2021-08-15 DIAGNOSIS — H401122 Primary open-angle glaucoma, left eye, moderate stage: Secondary | ICD-10-CM | POA: Diagnosis not present

## 2021-09-25 DIAGNOSIS — H401122 Primary open-angle glaucoma, left eye, moderate stage: Secondary | ICD-10-CM | POA: Diagnosis not present

## 2021-12-12 DIAGNOSIS — Z1331 Encounter for screening for depression: Secondary | ICD-10-CM | POA: Diagnosis not present

## 2021-12-12 DIAGNOSIS — Z125 Encounter for screening for malignant neoplasm of prostate: Secondary | ICD-10-CM | POA: Diagnosis not present

## 2021-12-12 DIAGNOSIS — E7849 Other hyperlipidemia: Secondary | ICD-10-CM | POA: Diagnosis not present

## 2021-12-12 DIAGNOSIS — Z6830 Body mass index (BMI) 30.0-30.9, adult: Secondary | ICD-10-CM | POA: Diagnosis not present

## 2021-12-12 DIAGNOSIS — E119 Type 2 diabetes mellitus without complications: Secondary | ICD-10-CM | POA: Diagnosis not present

## 2021-12-12 DIAGNOSIS — E782 Mixed hyperlipidemia: Secondary | ICD-10-CM | POA: Diagnosis not present

## 2021-12-12 DIAGNOSIS — Z0001 Encounter for general adult medical examination with abnormal findings: Secondary | ICD-10-CM | POA: Diagnosis not present

## 2021-12-12 DIAGNOSIS — E6609 Other obesity due to excess calories: Secondary | ICD-10-CM | POA: Diagnosis not present

## 2021-12-12 DIAGNOSIS — J4540 Moderate persistent asthma, uncomplicated: Secondary | ICD-10-CM | POA: Diagnosis not present

## 2021-12-12 DIAGNOSIS — M1990 Unspecified osteoarthritis, unspecified site: Secondary | ICD-10-CM | POA: Diagnosis not present

## 2021-12-12 DIAGNOSIS — M479 Spondylosis, unspecified: Secondary | ICD-10-CM | POA: Diagnosis not present

## 2021-12-12 DIAGNOSIS — Z23 Encounter for immunization: Secondary | ICD-10-CM | POA: Diagnosis not present

## 2022-03-12 DIAGNOSIS — H401113 Primary open-angle glaucoma, right eye, severe stage: Secondary | ICD-10-CM | POA: Diagnosis not present

## 2022-05-31 ENCOUNTER — Ambulatory Visit
Admission: EM | Admit: 2022-05-31 | Discharge: 2022-05-31 | Disposition: A | Discharge status: Home / Self Care | Payer: Medicare Other | Attending: Nurse Practitioner | Admitting: Nurse Practitioner

## 2022-05-31 DIAGNOSIS — R062 Wheezing: Secondary | ICD-10-CM | POA: Diagnosis not present

## 2022-05-31 DIAGNOSIS — J069 Acute upper respiratory infection, unspecified: Secondary | ICD-10-CM | POA: Diagnosis not present

## 2022-05-31 DIAGNOSIS — Z8709 Personal history of other diseases of the respiratory system: Secondary | ICD-10-CM

## 2022-05-31 MED ORDER — BENZONATATE 100 MG PO CAPS: 100.0000 mg | ORAL_CAPSULE | Freq: Three times a day (TID) | ORAL | 0 refills | Status: DC

## 2022-05-31 MED ORDER — PREDNISONE 20 MG PO TABS: 40.0000 mg | ORAL_TABLET | Freq: Every day | ORAL | 0 refills | Status: AC

## 2022-05-31 MED ORDER — FLUTICASONE PROPIONATE 50 MCG/ACT NA SUSP: 2.0000 | Freq: Every day | NASAL | 0 refills | Status: DC

## 2022-05-31 MED ORDER — METHYLPREDNISOLONE SODIUM SUCC 125 MG IJ SOLR
60.0000 mg | Freq: Once | INTRAMUSCULAR | Status: AC
  Administered 2022-05-31: 60 mg via INTRAMUSCULAR

## 2022-05-31 NOTE — ED Triage Notes (Signed)
Pt reports cough and congestion x 2 days. Pt reports his wife is sick. Alka Seltzer cold and sinus gives some relief.

## 2022-05-31 NOTE — ED Provider Notes (Signed)
[facility]    CSN: [id number] Arrival date & time: 05/31/22  1428      History   Chief Complaint Chief Complaint  Patient presents with   Cough    HPI Mark Harris is a 82 y.o. male.   The history is provided by the patient.   The patient presents with a 2-day history of nasal congestion, runny nose, scratchy throat, and cough.  Patient denies fever, chills, headache, ear pain, shortness of breath, difficulty breathing, abdominal pain, nausea, vomiting, or diarrhea.  Patient states that he has noticed that he has been wheezing.  He states that his wife is also sick.  Patient states that he has been taking Alka-Seltzer for his symptoms, with good relief.  Past Medical History:  Diagnosis Date   Asthma    Dyspnea    Glaucoma    right eye   History of kidney stones    Hypertension     Patient Active Problem List   Diagnosis Date Noted   S/P partial colectomy 11/03/2016   Dysplastic polyp of colon    Guaiac positive stools 09/16/2016   Unilateral recurrent inguinal hernia without obstruction or gangrene     Past Surgical History:  Procedure Laterality Date   CIRCUMCISION N/A 09/01/2016   Procedure: CIRCUMCISION ADULT;  Surgeon: Cleon Gustin, MD;  Location: [facility];  Service: Urology;  Laterality: N/A;  2 HRS TOTAL  - CORDINATED CASE W/ DR Arnoldo Morale WHO WILL GO 2ND [place] NEEDS 60 MIN FOR CIRC [phone] MEDICARE-[id number] A BCBS-[id number]   COLECTOMY     COLONOSCOPY N/A 10/02/2016   Procedure: COLONOSCOPY;  Surgeon: Rogene Houston, MD;  Location: [facility];  Service: Endoscopy;  Laterality: N/A;  1:00   HERNIA REPAIR     umbilical   INGUINAL HERNIA REPAIR Right 06/25/2012   Procedure: HERNIA REPAIR INGUINAL ADULT;  Surgeon: Donato Heinz, MD;  Location: [facility];  Service: General;  Laterality: Right;   INGUINAL HERNIA REPAIR N/A 09/01/2016   Procedure: RECURRENT HERNIA REPAIR INGUINAL ADULT WITH MESH;  Surgeon: Aviva Signs, MD;   Location: [facility];  Service: General;  Laterality: N/A;   INSERTION OF MESH Right 06/25/2012   Procedure: INSERTION OF MESH;  Surgeon: Donato Heinz, MD;  Location: [facility];  Service: General;  Laterality: Right;   PARTIAL COLECTOMY N/A 11/03/2016   Procedure: PARTIAL COLECTOMY;  Surgeon: Aviva Signs, MD;  Location: [facility];  Service: General;  Laterality: N/A;   POLYPECTOMY  10/02/2016   Procedure: POLYPECTOMY;  Surgeon: Rogene Houston, MD;  Location: [facility];  Service: Endoscopy;;  colon       Home Medications    Prior to Admission medications   Medication Sig Start Date End Date Taking? Authorizing Provider  benzonatate (TESSALON) 100 MG capsule Take 1 capsule (100 mg total) by mouth every 8 (eight) hours. 05/31/22  Yes Klein Willcox-Warren, Alda Lea, NP  fluticasone (FLONASE) 50 MCG/ACT nasal spray Place 2 sprays into both nostrils daily. 05/31/22  Yes Maleeha Halls-Warren, Alda Lea, NP  predniSONE (DELTASONE) 20 MG tablet Take 2 tablets (40 mg total) by mouth daily with breakfast for 5 days. 05/31/22 06/05/22 Yes Eimi Viney-Warren, Alda Lea, NP  albuterol (PROVENTIL HFA;VENTOLIN HFA) 108 (90 BASE) MCG/ACT inhaler Inhale 2 puffs into the lungs every 6 (six) hours as needed for wheezing or shortness of breath (for asthma).     [provider]  bimatoprost (LUMIGAN) 0.01 % SOLN Place 1 drop into both eyes at bedtime.  [provider]  Brinzolamide-Brimonidine (SIMBRINZA) 1-0.2 % SUSP Place 1 drop into the right eye 3 (three) times daily.    [provider]  metroNIDAZOLE (FLAGYL) 500 MG tablet Take 1 tablet (500 mg total) by mouth 3 (three) times daily. Take one tablet po at 1pm, 2pm, and 9pm the day before surgery 10/28/16   Aviva Signs, MD  neomycin (MYCIFRADIN) 500 MG tablet Take 2 tablets (1,000 mg total) by mouth 3 (three) times daily. Take one tablet po at 1pm, 2pm, and 9pm the day before surgery 10/28/16   Aviva Signs, MD  olmesartan-hydrochlorothiazide (BENICAR  HCT) 40-25 MG tablet Take 0.5 tablets by mouth daily with lunch.  08/12/16   [provider]  Potassium 99 MG TABS Take 99 mg by mouth 2 (two) times a week.     [provider]  SYMBICORT 80-4.5 MCG/ACT inhaler Inhale 2 puffs into the lungs daily. For breathing/respiratory issues 08/12/16   [provider]    Family History Family History  Problem Relation Age of Onset   Colon cancer Neg Hx     Social History Social History   Tobacco Use   Smoking status: Former    Types: Cigars   Smokeless tobacco: Never   Tobacco comments:    "chewed" cigars  Vaping Use   Vaping Use: Never used  Substance Use Topics   Alcohol use: Yes    Comment: one glass of wine a day   Drug use: No     Allergies   Patient has no known allergies.   Review of Systems Review of Systems Per HPI  Physical Exam Triage Vital Signs ED Triage Vitals  Enc Vitals Group     BP 05/31/22 1451 (!) 179/75     Pulse Rate 05/31/22 1451 74     Resp 05/31/22 1451 18     Temp 05/31/22 1451 98.6 F (37 C)     Temp Source 05/31/22 1451 Oral     SpO2 05/31/22 1451 93 %     Weight --      Height --      Head Circumference --      Peak Flow --      Pain Score 05/31/22 1453 0     Pain Loc --      Pain Edu? --      Excl. in [place]? --    No data found.  Updated Vital Signs BP (!) 179/75 (BP Location: Right Arm)   Pulse 74   Temp 98.6 F (37 C) (Oral)   Resp 18   SpO2 93%   Visual Acuity Right Eye Distance:   Left Eye Distance:   Bilateral Distance:    Right Eye Near:   Left Eye Near:    Bilateral Near:     Physical Exam Vitals and nursing note reviewed.  Constitutional:      General: He is not in acute distress.    Appearance: Normal appearance.  HENT:     Head: Normocephalic.     Right Ear: Tympanic membrane, ear canal and external ear normal.     Left Ear: Tympanic membrane, ear canal and external ear normal.     Nose: Congestion and rhinorrhea present.     Right  Turbinates: Enlarged and swollen.     Left Turbinates: Enlarged and swollen.     Right Sinus: No maxillary sinus tenderness or frontal sinus tenderness.     Left Sinus: No maxillary sinus tenderness or frontal sinus tenderness.  Mouth/Throat:     Mouth: Mucous membranes are moist.     Pharynx: Oropharynx is clear. Uvula midline. Posterior oropharyngeal erythema present. No pharyngeal swelling, oropharyngeal exudate or uvula swelling.  Eyes:     Extraocular Movements: Extraocular movements intact.     Pupils: Pupils are equal, round, and reactive to light.  Cardiovascular:     Rate and Rhythm: Normal rate and regular rhythm.     Pulses: Normal pulses.     Heart sounds: Normal heart sounds.  Pulmonary:     Effort: Pulmonary effort is normal.     Breath sounds: Wheezing (faint expiratory wheezing noted in the bilateral lower lungs) present.  Abdominal:     General: Bowel sounds are normal.     Palpations: Abdomen is soft.     Tenderness: There is no abdominal tenderness.  Musculoskeletal:     Cervical back: Normal range of motion.  Lymphadenopathy:     Cervical: No cervical adenopathy.  Skin:    General: Skin is warm and dry.  Neurological:     General: No focal deficit present.     Mental Status: He is alert and oriented to person, place, and time.  Psychiatric:        Mood and Affect: Mood normal.        Behavior: Behavior normal.      UC Treatments / Results  Labs (all labs ordered are listed, but only abnormal results are displayed) Labs Reviewed - No data to display  EKG   Radiology No results found.  Procedures Procedures (including critical care time)  Medications Ordered in UC Medications  methylPREDNISolone sodium succinate (SOLU-MEDROL) 125 mg/2 mL injection 60 mg (60 mg Intramuscular Given 05/31/22 1516)    Initial Impression / Assessment and Plan / UC Course  I have reviewed the triage vital signs and the nursing notes.  Pertinent labs & imaging  results that were available during my care of the patient were reviewed by me and considered in my medical decision making (see chart for details).  The patient is well-appearing, he is in no acute distress, vital signs are stable.  Patient with history of asthma.  On exam, he does have faint expiratory wheezing in the bilateral lower lung fields.  He is in no acute distress at this time.  Suspect symptoms of his asthma have been exacerbated by a viral upper respiratory infection.  Patient was given Solu-Medrol 60 mg IM in the office for bronchial inflammation.  Patient was prescribed prednisone 40 mg for the next 5 days, fluticasone 50 mcg nasal spray to help with his nasal congestion and runny nose, and Tessalon 100 mg for his cough.  Supportive care recommendations were provided to the patient to include increasing fluids, allowing for plenty of rest, and use of Tylenol as needed.  Patient was given strict ER follow-up precautions.  Patient verbalizes understanding and is in agreement with this plan of care.  All questions were answered.  Patient stable for discharge.   Final Clinical Impressions(s) / UC Diagnoses   Final diagnoses:  Viral upper respiratory tract infection with cough  Wheezing  History of asthma     Discharge Instructions      Take medication as prescribed.  Your regular medications as prescribed. Increase fluids and allow for plenty of rest. Recommend over-the-counter Tylenol as needed for pain, fever, or general discomfort. Recommend use of a humidifier in your bedroom at nighttime during sleep and sleeping elevated on pillows while cough symptoms persist. If  you develop worsening wheezing, shortness of breath, difficulty breathing, or become unable to speak in a complete sentence, please go to the emergency department immediately for further evaluation. Follow-up as needed.     ED Prescriptions     Medication Sig Dispense Auth. Provider   predniSONE (DELTASONE)  20 MG tablet Take 2 tablets (40 mg total) by mouth daily with breakfast for 5 days. 10 tablet Akya Fiorello-Warren, Alda Lea, NP   benzonatate (TESSALON) 100 MG capsule Take 1 capsule (100 mg total) by mouth every 8 (eight) hours. 30 capsule Nasim Garofano-Warren, Alda Lea, NP   fluticasone (FLONASE) 50 MCG/ACT nasal spray Place 2 sprays into both nostrils daily. 16 g Deltha Bernales-Warren, Alda Lea, NP      PDMP not reviewed this encounter.   Tish Men, NP 05/31/22 [phone]

## 2022-05-31 NOTE — Discharge Instructions (Addendum)
Take medication as prescribed.  Your regular medications as prescribed. Increase fluids and allow for plenty of rest. Recommend over-the-counter Tylenol as needed for pain, fever, or general discomfort. Recommend use of a humidifier in your bedroom at nighttime during sleep and sleeping elevated on pillows while cough symptoms persist. If you develop worsening wheezing, shortness of breath, difficulty breathing, or become unable to speak in a complete sentence, please go to the emergency department immediately for further evaluation. Follow-up as needed.

## 2022-09-11 DIAGNOSIS — H401113 Primary open-angle glaucoma, right eye, severe stage: Secondary | ICD-10-CM | POA: Diagnosis not present

## 2022-09-11 DIAGNOSIS — Z961 Presence of intraocular lens: Secondary | ICD-10-CM | POA: Diagnosis not present

## 2022-09-11 DIAGNOSIS — H25012 Cortical age-related cataract, left eye: Secondary | ICD-10-CM | POA: Diagnosis not present

## 2022-09-11 DIAGNOSIS — H2512 Age-related nuclear cataract, left eye: Secondary | ICD-10-CM | POA: Diagnosis not present

## 2023-05-14 DIAGNOSIS — Z0001 Encounter for general adult medical examination with abnormal findings: Secondary | ICD-10-CM | POA: Diagnosis not present

## 2023-05-14 DIAGNOSIS — E1159 Type 2 diabetes mellitus with other circulatory complications: Secondary | ICD-10-CM | POA: Diagnosis not present

## 2023-05-14 DIAGNOSIS — E782 Mixed hyperlipidemia: Secondary | ICD-10-CM | POA: Diagnosis not present

## 2023-05-14 DIAGNOSIS — Z6830 Body mass index (BMI) 30.0-30.9, adult: Secondary | ICD-10-CM | POA: Diagnosis not present

## 2023-05-14 DIAGNOSIS — Z1331 Encounter for screening for depression: Secondary | ICD-10-CM | POA: Diagnosis not present

## 2023-05-14 DIAGNOSIS — E663 Overweight: Secondary | ICD-10-CM | POA: Diagnosis not present

## 2023-05-14 DIAGNOSIS — E119 Type 2 diabetes mellitus without complications: Secondary | ICD-10-CM | POA: Diagnosis not present

## 2023-05-14 DIAGNOSIS — J4540 Moderate persistent asthma, uncomplicated: Secondary | ICD-10-CM | POA: Diagnosis not present

## 2023-05-21 ENCOUNTER — Ambulatory Visit
Admission: EM | Admit: 2023-05-21 | Discharge: 2023-05-21 | Disposition: A | Discharge status: Home / Self Care | Attending: Nurse Practitioner | Admitting: Nurse Practitioner

## 2023-05-21 ENCOUNTER — Encounter: Payer: Self-pay | Admitting: Emergency Medicine

## 2023-05-21 ENCOUNTER — Other Ambulatory Visit: Payer: Self-pay

## 2023-05-21 DIAGNOSIS — J22 Unspecified acute lower respiratory infection: Secondary | ICD-10-CM | POA: Diagnosis not present

## 2023-05-21 DIAGNOSIS — Z8709 Personal history of other diseases of the respiratory system: Secondary | ICD-10-CM | POA: Diagnosis not present

## 2023-05-21 LAB — POC COVID19/FLU A&B COMBO
Covid Antigen, POC: NEGATIVE
Influenza A Antigen, POC: NEGATIVE
Influenza B Antigen, POC: NEGATIVE

## 2023-05-21 MED ORDER — DEXAMETHASONE SODIUM PHOSPHATE 10 MG/ML IJ SOLN
10.0000 mg | INTRAMUSCULAR | Status: AC
  Administered 2023-05-21: 10 mg via INTRAMUSCULAR

## 2023-05-21 MED ORDER — PREDNISONE 20 MG PO TABS: 40.0000 mg | ORAL_TABLET | Freq: Every day | ORAL | 0 refills | Status: AC

## 2023-05-21 MED ORDER — BENZONATATE 100 MG PO CAPS: 100.0000 mg | ORAL_CAPSULE | Freq: Three times a day (TID) | ORAL | 0 refills | Status: AC | PRN

## 2023-05-21 MED ORDER — AMOXICILLIN 500 MG PO CAPS: 500.0000 mg | ORAL_CAPSULE | Freq: Two times a day (BID) | ORAL | 0 refills | Status: AC

## 2023-05-21 NOTE — ED Triage Notes (Addendum)
 Pt reports productive cough, sneezing, and "flare-up" of my asthma since Tuesday. Mild auditory wheezing noted in triage. Last use of inhaler on the way to UC.   Dyspnea noted with exertion, mild wheezing no longer noted at end of triage. NAD noted.

## 2023-05-21 NOTE — ED Provider Notes (Signed)
 [facility]    CSN: [id number] Arrival date & time: 05/21/23  1120      History   Chief Complaint Chief Complaint  Patient presents with   Cough    HPI Mark Harris is a 83 y.o. male.   The history is provided by the patient.   Patient presents with a several day history of productive cough, sneezing, and wheezing.  Patient states over the past several days, his asthma has been "flaring up."  He denies fever, chills, chest pain, abdominal pain, nausea, vomiting, diarrhea, or rash.  States that he does get some shortness of breath with exertion.  States he has been using his inhaler and nebulizer at home.    Past Medical History:  Diagnosis Date   Asthma    Dyspnea    Glaucoma    right eye   History of kidney stones    Hypertension     Patient Active Problem List   Diagnosis Date Noted   S/P partial colectomy 11/03/2016   Dysplastic polyp of colon    Guaiac positive stools 09/16/2016   Unilateral recurrent inguinal hernia without obstruction or gangrene     Past Surgical History:  Procedure Laterality Date   CIRCUMCISION N/A 09/01/2016   Procedure: CIRCUMCISION ADULT;  Surgeon: Malen Gauze, MD;  Location: [facility];  Service: Urology;  Laterality: N/A;  2 HRS TOTAL  - CORDINATED CASE W/ DR Lovell Sheehan WHO WILL GO 2ND [place] NEEDS 60 MIN FOR CIRC [phone] MEDICARE-[id number] A BCBS-[id number]   COLECTOMY     COLONOSCOPY N/A 10/02/2016   Procedure: COLONOSCOPY;  Surgeon: Malissa Hippo, MD;  Location: [facility];  Service: Endoscopy;  Laterality: N/A;  1:00   HERNIA REPAIR     umbilical   INGUINAL HERNIA REPAIR Right 06/25/2012   Procedure: HERNIA REPAIR INGUINAL ADULT;  Surgeon: Fabio Bering, MD;  Location: [facility];  Service: General;  Laterality: Right;   INGUINAL HERNIA REPAIR N/A 09/01/2016   Procedure: RECURRENT HERNIA REPAIR INGUINAL ADULT WITH MESH;  Surgeon: Franky Macho, MD;  Location: [facility];  Service: General;   Laterality: N/A;   INSERTION OF MESH Right 06/25/2012   Procedure: INSERTION OF MESH;  Surgeon: Fabio Bering, MD;  Location: [facility];  Service: General;  Laterality: Right;   PARTIAL COLECTOMY N/A 11/03/2016   Procedure: PARTIAL COLECTOMY;  Surgeon: Franky Macho, MD;  Location: [facility];  Service: General;  Laterality: N/A;   POLYPECTOMY  10/02/2016   Procedure: POLYPECTOMY;  Surgeon: Malissa Hippo, MD;  Location: [facility];  Service: Endoscopy;;  colon       Home Medications    Prior to Admission medications   Medication Sig Start Date End Date Taking? Authorizing Provider  amoxicillin (AMOXIL) 500 MG capsule Take 1 capsule (500 mg total) by mouth 2 (two) times daily for 7 days. 05/21/23 05/28/23 Yes Leath-Warren, Sadie Haber, NP  benzonatate (TESSALON) 100 MG capsule Take 1 capsule (100 mg total) by mouth 3 (three) times daily as needed for cough. 05/21/23  Yes Leath-Warren, Sadie Haber, NP  predniSONE (DELTASONE) 20 MG tablet Take 2 tablets (40 mg total) by mouth daily with breakfast for 5 days. 05/21/23 05/26/23 Yes Leath-Warren, Sadie Haber, NP  albuterol (PROVENTIL HFA;VENTOLIN HFA) 108 (90 BASE) MCG/ACT inhaler Inhale 2 puffs into the lungs every 6 (six) hours as needed for wheezing or shortness of breath (for asthma).     [provider]  bimatoprost (LUMIGAN) 0.01 % SOLN Place 1  drop into both eyes at bedtime.    [provider]  Brinzolamide-Brimonidine (SIMBRINZA) 1-0.2 % SUSP Place 1 drop into the right eye 3 (three) times daily.    [provider]  fluticasone (FLONASE) 50 MCG/ACT nasal spray Place 2 sprays into both nostrils daily. 05/31/22   Leath-Warren, Sadie Haber, NP  metroNIDAZOLE (FLAGYL) 500 MG tablet Take 1 tablet (500 mg total) by mouth 3 (three) times daily. Take one tablet po at 1pm, 2pm, and 9pm the day before surgery 10/28/16   Franky Macho, MD  neomycin (MYCIFRADIN) 500 MG tablet Take 2 tablets (1,000 mg total) by mouth 3 (three) times daily.  Take one tablet po at 1pm, 2pm, and 9pm the day before surgery 10/28/16   Franky Macho, MD  olmesartan-hydrochlorothiazide (BENICAR HCT) 40-25 MG tablet Take 0.5 tablets by mouth daily with lunch.  08/12/16   [provider]  Potassium 99 MG TABS Take 99 mg by mouth 2 (two) times a week.     [provider]  SYMBICORT 80-4.5 MCG/ACT inhaler Inhale 2 puffs into the lungs daily. For breathing/respiratory issues 08/12/16   [provider]    Family History Family History  Problem Relation Age of Onset   Colon cancer Neg Hx     Social History Social History   Tobacco Use   Smoking status: Former    Types: Cigars   Smokeless tobacco: Never   Tobacco comments:    "chewed" cigars  Vaping Use   Vaping status: Never Used  Substance Use Topics   Alcohol use: Yes    Comment: one glass of wine a day   Drug use: No     Allergies   Patient has no known allergies.   Review of Systems Review of Systems Per HPI  Physical Exam Triage Vital Signs ED Triage Vitals [05/21/23 1205]  Encounter Vitals Group     BP (!) 199/74     Systolic BP Percentile      Diastolic BP Percentile      Pulse Rate 93     Resp (!) 22     Temp 99.7 F (37.6 C)     Temp Source Oral     SpO2 93 %     Weight      Height      Head Circumference      Peak Flow      Pain Score 0     Pain Loc      Pain Education      Exclude from Growth Chart    No data found.  Updated Vital Signs BP (!) 199/74 (BP Location: Right Arm)   Pulse 93   Temp 99.7 F (37.6 C) (Oral)   Resp (!) 22   SpO2 93%   Visual Acuity Right Eye Distance:   Left Eye Distance:   Bilateral Distance:    Right Eye Near:   Left Eye Near:    Bilateral Near:     Physical Exam Vitals and nursing note reviewed.  Constitutional:      General: He is not in acute distress.    Appearance: Normal appearance.  HENT:     Head: Normocephalic.     Right Ear: Tympanic membrane, ear canal and external ear normal.      Left Ear: Tympanic membrane, ear canal and external ear normal.     Nose: Congestion present.     Right Turbinates: Enlarged and swollen.     Left Turbinates: Enlarged and swollen.  Right Sinus: No maxillary sinus tenderness or frontal sinus tenderness.     Left Sinus: No maxillary sinus tenderness or frontal sinus tenderness.     Mouth/Throat:     Mouth: Mucous membranes are moist.  Eyes:     Extraocular Movements: Extraocular movements intact.     Conjunctiva/sclera: Conjunctivae normal.     Pupils: Pupils are equal, round, and reactive to light.  Cardiovascular:     Rate and Rhythm: Regular rhythm. Tachycardia present.     Pulses: Normal pulses.     Heart sounds: Normal heart sounds.  Pulmonary:     Effort: Pulmonary effort is normal. No respiratory distress.     Breath sounds: No stridor, decreased air movement or transmitted upper airway sounds. Examination of the right-upper field reveals wheezing. Examination of the left-upper field reveals wheezing. Examination of the right-lower field reveals wheezing. Examination of the left-lower field reveals wheezing. Wheezing present. No decreased breath sounds, rhonchi or rales.     Comments: Faint expiratory wheezing noted in the posterior upper/lower lung fields Chest:     Chest wall: No tenderness.  Abdominal:     General: Bowel sounds are normal.     Palpations: Abdomen is soft.     Tenderness: There is no abdominal tenderness.  Musculoskeletal:     Cervical back: Normal range of motion.  Lymphadenopathy:     Cervical: No cervical adenopathy.  Skin:    General: Skin is warm and dry.  Neurological:     General: No focal deficit present.     Mental Status: He is alert and oriented to person, place, and time.  Psychiatric:        Mood and Affect: Mood normal.        Behavior: Behavior normal.      UC Treatments / Results  Labs (all labs ordered are listed, but only abnormal results are displayed) Labs Reviewed   POC COVID19/FLU A&B COMBO - Normal    EKG   Radiology No results found.  Procedures Procedures (including critical care time)  Medications Ordered in UC Medications  dexamethasone (DECADRON) injection 10 mg (10 mg Intramuscular Given 05/21/23 1425)    Initial Impression / Assessment and Plan / UC Course  I have reviewed the triage vital signs and the nursing notes.  Pertinent labs & imaging results that were available during my care of the patient were reviewed by me and considered in my medical decision making (see chart for details).  COVID/flu test was negative.  On exam, patient with faint expiratory wheezing noted throughout.  Decadron 10 mg IM administered.  Will start patient on prednisone 40 mg for the next 5 days, Tessalon 100 mg for his cough, and amoxicillin 500 mg twice daily for the next 7 days for possible lower respiratory infection.  Supportive care recommendations were provided and discussed with the patient to include fluids, rest, and over-the-counter analgesics.  Discussed indications regarding follow-up.  Patient was in agreement with this plan of care and verbalizes understanding.  All questions were answered.  Patient stable for discharge.  Final Clinical Impressions(s) / UC Diagnoses   Final diagnoses:  Lower respiratory infection  History of asthma     Discharge Instructions      The COVID/flu test was negative. Take medication as prescribed. Increase fluids and allow for plenty of rest. May use normal saline nasal spray throughout the day for nasal congestion and runny nose. For your cough, recommend use of a humidifier in your bedroom at nighttime  during sleep and sleeping elevated on pillows while cough symptoms persist. If symptoms fail to improve with this treatment, please follow-up with your primary care physician for further evaluation. Follow-up as needed.     ED Prescriptions     Medication Sig Dispense Auth. Provider   predniSONE  (DELTASONE) 20 MG tablet Take 2 tablets (40 mg total) by mouth daily with breakfast for 5 days. 10 tablet Leath-Warren, Sadie Haber, NP   benzonatate (TESSALON) 100 MG capsule Take 1 capsule (100 mg total) by mouth 3 (three) times daily as needed for cough. 30 capsule Leath-Warren, Sadie Haber, NP   amoxicillin (AMOXIL) 500 MG capsule Take 1 capsule (500 mg total) by mouth 2 (two) times daily for 7 days. 14 capsule Leath-Warren, Sadie Haber, NP      PDMP not reviewed this encounter.   Abran Cantor, NP 05/21/23 1428

## 2023-05-21 NOTE — Discharge Instructions (Addendum)
 The COVID/flu test was negative. Take medication as prescribed. Increase fluids and allow for plenty of rest. May use normal saline nasal spray throughout the day for nasal congestion and runny nose. For your cough, recommend use of a humidifier in your bedroom at nighttime during sleep and sleeping elevated on pillows while cough symptoms persist. If symptoms fail to improve with this treatment, please follow-up with your primary care physician for further evaluation. Follow-up as needed.

## 2023-05-21 NOTE — ED Notes (Addendum)
 Discussed pt presentation and signs/symptoms with NP and reports pt stable to return to waiting room to wait for treatment room. Pt advised to notify staff if anything changes or gets worse. Pt also has inhaler to use as needed.

## 2023-07-10 DIAGNOSIS — H401113 Primary open-angle glaucoma, right eye, severe stage: Secondary | ICD-10-CM | POA: Diagnosis not present

## 2023-10-21 ENCOUNTER — Encounter: Payer: Self-pay | Admitting: Family Medicine

## 2023-10-21 ENCOUNTER — Ambulatory Visit: Admitting: Family Medicine

## 2023-10-21 VITALS — BP 152/84 | HR 59 | Temp 97.9°F | Ht 66.0 in | Wt 190.6 lb

## 2023-10-21 DIAGNOSIS — Z23 Encounter for immunization: Secondary | ICD-10-CM

## 2023-10-21 DIAGNOSIS — Z7689 Persons encountering health services in other specified circumstances: Secondary | ICD-10-CM

## 2023-10-21 DIAGNOSIS — I10 Essential (primary) hypertension: Secondary | ICD-10-CM

## 2023-10-21 DIAGNOSIS — J45909 Unspecified asthma, uncomplicated: Secondary | ICD-10-CM | POA: Diagnosis not present

## 2023-10-21 DIAGNOSIS — R7309 Other abnormal glucose: Secondary | ICD-10-CM

## 2023-10-21 MED ORDER — OLMESARTAN MEDOXOMIL 40 MG PO TABS: 40.0000 mg | ORAL_TABLET | Freq: Every day | ORAL | 1 refills | Status: AC

## 2023-10-21 NOTE — Progress Notes (Unsigned)
 Patient Office Visit  Assessment & Plan:  Encounter to establish care -     CBC with Differential/Platelet -     Comprehensive metabolic panel with GFR  Hypertension, unspecified type -     Olmesartan  Medoxomil; Take 1 tablet (40 mg total) by mouth daily.  Dispense: 90 tablet; Refill: 1 -     CBC with Differential/Platelet -     Comprehensive metabolic panel with GFR  Need for pneumococcal 20-valent conjugate vaccination -     Pneumococcal conjugate vaccine 20-valent  Moderate asthma, unspecified whether complicated, unspecified whether persistent  Elevated glucose -     Hemoglobin A1c   Assessment and Plan    Hypertension Blood pressure remains uncontrolled. Previous Benicar  20 mg was inadequate; Benicar  40 mg with diuretic caused excessive urination. High salt intake noted. - Increase Benicar  to 40 mg without diuretic. - Schedule follow-up in a few weeks to assess blood pressure control. Return in a few weeks.   Asthma per patient Asthma managed with Symbicort and Ventolin . No recent exacerbations or frequent Ventolin  use. - Continue Symbicort and Ventolin .  Colonic polyp, status post partial colectomy (2018) Dysplastic polyp removed in 2018. No symptoms or family history of colon cancer. No gastroenterology follow-up post-surgery. - No immediate gastroenterology follow-up needed.  General Health Maintenance Vaccinations and screenings discussed. No pneumonia vaccine record, outdated tetanus status, history of shingles without vaccine. Blood work needed. - Administer pneumonia vaccine. - Consider tetanus vaccination. - Perform blood work to assess current health status.       Return in about 4 weeks (around 11/18/2023), or if symptoms worsen or fail to improve, for hypertension.   Subjective:    Patient ID: Mark Harris, male    DOB: February 11, 1941  Age: 83 y.o. MRN: [id number]  Chief Complaint  Patient presents with   Establish Care    HPI Discussed the  use of AI scribe software for clinical note transcription with the patient, who gave verbal consent to proceed.  History of Present Illness        Mark Harris is an 83 year old male with hypertension and asthma who presents for medication management and follow-up. Previous PCP is retiring so now establishing here.   He has a history of hypertension and is currently taking Benicar  20 mg. Previously, he was on Benicar  40 mg with a diuretic, but discontinued the diuretic due to excessive urination. He does not monitor his blood pressure at home and is unsure if he can feel when it is elevated. He follows a high salt diet, described as a 'Southern diet', with moderate fried food intake.  He has a lifelong history of asthma and uses Symbicort and Ventolin  inhalers. Ventolin  is used infrequently, sometimes not at all during the day. He has had past emergency room visits for asthma exacerbations, the last being when he was 83 years old, and has visited urgent care for wheezing and congestion.  He has a history of a dysplastic polyp in the colon, which was removed in 2018. He did not follow up with a gastroenterologist after the surgery. There is no family history of colon cancer, and he denies any current symptoms such as blood in the stool.  He denies any history of diabetes, and his blood sugar levels have been normal, although they were slightly elevated in 2018. He does not consume many sweets and maintains a stable weight around 190 pounds, occasionally dropping to 185 pounds.  He has not received a pneumonia  shot or shingles vaccine, although he has had shingles in the past. He is unsure about his last tetanus shot. He reports feeling generally healthy and engages in physical activity by mowing his half-acre yard. Physical Exam MEASUREMENTS: Weight- 190. HEENT: Ears clean Results LABS Blood glucose: Elevated (2018)  DIAGNOSTIC Colonoscopy: Dysplastic polyp (2018) Assessment &  Plan Hypertension Blood pressure remains uncontrolled. Previous Benicar  20 mg was inadequate; Benicar  40 mg with diuretic caused excessive urination. High salt intake noted. - Increase Benicar  to 40 mg without diuretic. - Schedule follow-up in a few weeks to assess blood pressure control. Return in a few weeks.   Asthma per patient Asthma managed with Symbicort and Ventolin . No recent exacerbations or frequent Ventolin  use. - Continue Symbicort and Ventolin . Previous history of elevated glucose- will check A1C today Colonic polyp, status post partial colectomy (2018) Dysplastic polyp removed in 2018. No symptoms or family history of colon cancer. No gastroenterology follow-up post-surgery. - No immediate gastroenterology follow-up needed.  General Health Maintenance Vaccinations and screenings discussed. No pneumonia vaccine record, outdated tetanus status, history of shingles without vaccine. Blood work needed. - Administer pneumonia vaccine. - Consider tetanus vaccination. - Perform blood work to assess current health status.    The ASCVD Risk score (Arnett DK, et al., 2019) failed to calculate for the following reasons:   The 2019 ASCVD risk score is only valid for ages 41 to 52  Past Medical History:  Diagnosis Date   Asthma    Dyspnea    Glaucoma    right eye   History of kidney stones    Hypertension    Past Surgical History:  Procedure Laterality Date   CIRCUMCISION N/A 09/01/2016   Procedure: CIRCUMCISION ADULT;  Surgeon: Sherrilee Belvie CROME, MD;  Location: [facility];  Service: Urology;  Laterality: N/A;  2 HRS TOTAL  - CORDINATED CASE W/ DR MAVIS WHO WILL GO 2ND MCKENZIE NEEDS 60 MIN FOR CIRC [phone] MEDICARE-[id number] A BCBS-[id number]   COLECTOMY     COLONOSCOPY N/A 10/02/2016   Procedure: COLONOSCOPY;  Surgeon: Golda Claudis PENNER, MD;  [facility]: [facility];  Service: Endoscopy;  Laterality: N/A;  1:00   HERNIA REPAIR     umbilical   INGUINAL HERNIA  REPAIR Right 06/25/2012   Procedure: HERNIA REPAIR INGUINAL ADULT;  Surgeon: Thresa JAYSON Pulling, MD;  Location: [facility];  Service: General;  Laterality: Right;   INGUINAL HERNIA REPAIR N/A 09/01/2016   Procedure: RECURRENT HERNIA REPAIR INGUINAL ADULT WITH MESH;  Surgeon: MAVIS Anes, MD;  Location: [facility];  Service: General;  Laterality: N/A;   INSERTION OF MESH Right 06/25/2012   Procedure: INSERTION OF MESH;  Surgeon: Thresa JAYSON Pulling, MD;  Location: [facility];  Service: General;  Laterality: Right;   PARTIAL COLECTOMY N/A 11/03/2016   Procedure: PARTIAL COLECTOMY;  Surgeon: MAVIS Anes, MD;  Location: [facility];  Service: General;  Laterality: N/A;   POLYPECTOMY  10/02/2016   Procedure: POLYPECTOMY;  Surgeon: Golda Claudis PENNER, MD;  [facility]: [facility];  Service: Endoscopy;;  colon   Social History   Tobacco Use   Smoking status: Former    Types: Cigars   Smokeless tobacco: Never   Tobacco comments:    chewed cigars  Vaping Use   Vaping status: Never Used  Substance Use Topics   Alcohol use: Yes    Comment: one glass of wine a day   Drug use: No   Family History  Problem Relation Age of Onset   Colon cancer  Neg Hx    No Known Allergies  ROS    Objective:    BP (!) 152/84   Pulse (!) 59   Temp 97.9 F (36.6 C)   Ht 5' 6 (1.676 m)   Wt 190 lb 9.6 oz (86.5 kg)   SpO2 97%   BMI 30.76 kg/m  BP Readings from Last 3 Encounters:  10/21/23 (!) 152/84  05/21/23 (!) 199/74  05/31/22 (!) 179/75   Wt Readings from Last 3 Encounters:  10/21/23 190 lb 9.6 oz (86.5 kg)  11/13/16 185 lb (83.9 kg)  11/03/16 192 lb (87.1 kg)    Physical Exam Vitals and nursing note reviewed.  Constitutional:      Appearance: Normal appearance.  HENT:     Head: Normocephalic.     Right Ear: Tympanic membrane, ear canal and external ear normal.     Left Ear: Tympanic membrane, ear canal and external ear normal.  Eyes:     Extraocular Movements: Extraocular movements intact.      Conjunctiva/sclera: Conjunctivae normal.     Pupils: Pupils are equal, round, and reactive to light.  Cardiovascular:     Rate and Rhythm: Normal rate and regular rhythm.     Heart sounds: Normal heart sounds.  Pulmonary:     Effort: Pulmonary effort is normal.     Breath sounds: Normal breath sounds.  Musculoskeletal:     Right lower leg: No edema.     Left lower leg: No edema.  Neurological:     General: No focal deficit present.     Mental Status: He is alert and oriented to person, place, and time.  Psychiatric:        Mood and Affect: Mood normal.        Behavior: Behavior normal.        Thought Content: Thought content normal.        Judgment: Judgment normal.      Results for orders placed or performed in visit on 10/21/23  CBC with Differential/Platelet  Result Value Ref Range   WBC 6.5 3.8 - 10.8 Thousand/uL   RBC 3.92 (L) 4.20 - 5.80 Million/uL   Hemoglobin 12.3 (L) 13.2 - 17.1 g/dL   HCT 62.7 (L) 61.4 - 49.9 %   MCV 94.9 80.0 - 100.0 fL   MCH 31.4 27.0 - 33.0 pg   MCHC 33.1 32.0 - 36.0 g/dL   RDW 87.3 88.9 - 84.9 %   Platelets 194 140 - 400 Thousand/uL   MPV 11.4 7.5 - 12.5 fL   Neutro Abs 3,939 1,500 - 7,800 cells/uL   Absolute Lymphocytes 1,586 850 - 3,900 cells/uL   Absolute Monocytes 592 200 - 950 cells/uL   Eosinophils Absolute 371 15 - 500 cells/uL   Basophils Absolute 13 0 - 200 cells/uL   Neutrophils Relative % 60.6 %   Total Lymphocyte 24.4 %   Monocytes Relative 9.1 %   Eosinophils Relative 5.7 %   Basophils Relative 0.2 %  Comprehensive metabolic panel with GFR  Result Value Ref Range   Glucose, Bld 143 (H) 65 - 99 mg/dL   BUN 15 7 - 25 mg/dL   Creat 9.01 9.29 - 8.77 mg/dL   eGFR 77 > OR = 60 fO/fpw/8.26f7   BUN/Creatinine Ratio SEE NOTE: 6 - 22 (calc)   Sodium 141 135 - 146 mmol/L   Potassium 4.8 3.5 - 5.3 mmol/L   Chloride 105 98 - 110 mmol/L   CO2 29 20 - 32 mmol/L  Calcium 9.1 8.6 - 10.3 mg/dL   Total Protein 6.7 6.1 - 8.1 g/dL    Albumin 4.0 3.6 - 5.1 g/dL   Globulin 2.7 1.9 - 3.7 g/dL (calc)   AG Ratio 1.5 1.0 - 2.5 (calc)   Total Bilirubin 0.3 0.2 - 1.2 mg/dL   Alkaline phosphatase (APISO) 63 35 - 144 U/L   AST 14 10 - 35 U/L   ALT 13 9 - 46 U/L  Hemoglobin A1c  Result Value Ref Range   Hgb A1c MFr Bld 6.9 (H) <5.7 %   Mean Plasma Glucose 151 mg/dL   eAG (mmol/L) 8.4 mmol/L

## 2023-10-22 LAB — COMPREHENSIVE METABOLIC PANEL WITH GFR
AG Ratio: 1.5 (calc) (ref 1.0–2.5)
ALT: 13 U/L (ref 9–46)
AST: 14 U/L (ref 10–35)
Albumin: 4 g/dL (ref 3.6–5.1)
Alkaline phosphatase (APISO): 63 U/L (ref 35–144)
BUN: 15 mg/dL (ref 7–25)
CO2: 29 mmol/L (ref 20–32)
Calcium: 9.1 mg/dL (ref 8.6–10.3)
Chloride: 105 mmol/L (ref 98–110)
Creat: 0.98 mg/dL (ref 0.70–1.22)
Globulin: 2.7 g/dL (ref 1.9–3.7)
Glucose, Bld: 143 mg/dL — ABNORMAL HIGH (ref 65–99)
Potassium: 4.8 mmol/L (ref 3.5–5.3)
Sodium: 141 mmol/L (ref 135–146)
Total Bilirubin: 0.3 mg/dL (ref 0.2–1.2)
Total Protein: 6.7 g/dL (ref 6.1–8.1)
eGFR: 77 mL/min/1.73m2 (ref >=60)

## 2023-10-22 LAB — CBC WITH DIFFERENTIAL/PLATELET
Absolute Lymphocytes: 1586 {cells}/uL (ref 850–3900)
Absolute Monocytes: 592 {cells}/uL (ref 200–950)
Basophils Absolute: 13 {cells}/uL (ref 0–200)
Basophils Relative: 0.2 %
Eosinophils Absolute: 371 {cells}/uL (ref 15–500)
Eosinophils Relative: 5.7 %
HCT: 37.2 % — ABNORMAL LOW (ref 38.5–50.0)
Hemoglobin: 12.3 g/dL — ABNORMAL LOW (ref 13.2–17.1)
MCH: 31.4 pg (ref 27.0–33.0)
MCHC: 33.1 g/dL (ref 32.0–36.0)
MCV: 94.9 fL (ref 80.0–100.0)
MPV: 11.4 fL (ref 7.5–12.5)
Monocytes Relative: 9.1 %
Neutro Abs: 3939 {cells}/uL (ref 1500–7800)
Neutrophils Relative %: 60.6 %
Platelets: 194 Thousand/uL (ref 140–400)
RBC: 3.92 Million/uL — ABNORMAL LOW (ref 4.20–5.80)
RDW: 12.6 % (ref 11.0–15.0)
Total Lymphocyte: 24.4 %
WBC: 6.5 Thousand/uL (ref 3.8–10.8)

## 2023-10-22 LAB — HEMOGLOBIN A1C
Hgb A1c MFr Bld: 6.9 % — ABNORMAL HIGH (ref <5.7)
Mean Plasma Glucose: 151 mg/dL
eAG (mmol/L): 8.4 mmol/L

## 2023-10-23 ENCOUNTER — Ambulatory Visit: Payer: Self-pay | Admitting: Family Medicine

## 2023-10-28 ENCOUNTER — Other Ambulatory Visit

## 2023-10-28 DIAGNOSIS — D508 Other iron deficiency anemias: Secondary | ICD-10-CM | POA: Diagnosis not present

## 2023-11-06 LAB — FECAL GLOBIN BY IMMUNOCHEMISTRY
FECAL GLOBIN RESULT:: NOT DETECTED
MICRO NUMBER:: 16864423
SPECIMEN QUALITY:: ADEQUATE

## 2023-11-09 ENCOUNTER — Ambulatory Visit: Payer: Self-pay | Admitting: Family Medicine

## 2023-11-20 ENCOUNTER — Encounter: Payer: Self-pay | Admitting: Family Medicine

## 2023-11-20 ENCOUNTER — Ambulatory Visit: Admitting: Family Medicine

## 2023-11-20 VITALS — BP 132/78 | HR 67 | Temp 98.8°F | Ht 66.0 in | Wt 181.2 lb

## 2023-11-20 DIAGNOSIS — I10 Essential (primary) hypertension: Secondary | ICD-10-CM | POA: Diagnosis not present

## 2023-11-20 DIAGNOSIS — R200 Anesthesia of skin: Secondary | ICD-10-CM | POA: Diagnosis not present

## 2023-11-20 DIAGNOSIS — J4540 Moderate persistent asthma, uncomplicated: Secondary | ICD-10-CM

## 2023-11-20 DIAGNOSIS — Z23 Encounter for immunization: Secondary | ICD-10-CM

## 2023-11-20 DIAGNOSIS — J302 Other seasonal allergic rhinitis: Secondary | ICD-10-CM | POA: Diagnosis not present

## 2023-11-20 DIAGNOSIS — D649 Anemia, unspecified: Secondary | ICD-10-CM | POA: Diagnosis not present

## 2023-11-20 DIAGNOSIS — R202 Paresthesia of skin: Secondary | ICD-10-CM

## 2023-11-20 MED ORDER — LORATADINE 10 MG PO TABS: 10.0000 mg | ORAL_TABLET | Freq: Every day | ORAL | 3 refills | Status: AC

## 2023-11-20 MED ORDER — ALBUTEROL SULFATE HFA 108 (90 BASE) MCG/ACT IN AERS: 2.0000 | INHALATION_SPRAY | Freq: Four times a day (QID) | RESPIRATORY_TRACT | 1 refills | Status: DC | PRN

## 2023-11-20 MED ORDER — GABAPENTIN 100 MG PO CAPS: 100.0000 mg | ORAL_CAPSULE | Freq: Every day | ORAL | 1 refills | Status: AC

## 2023-11-20 MED ORDER — FLUTICASONE PROPIONATE 50 MCG/ACT NA SUSP: 2.0000 | Freq: Every day | NASAL | 11 refills | Status: AC

## 2023-11-20 MED ORDER — BUDESONIDE-FORMOTEROL FUMARATE 80-4.5 MCG/ACT IN AERO: 2.0000 | INHALATION_SPRAY | Freq: Every day | RESPIRATORY_TRACT | 5 refills | Status: AC

## 2023-11-20 NOTE — Progress Notes (Signed)
 Patient Office Visit  Assessment & Plan:  Moderate persistent asthma without complication -     Albuterol  Sulfate HFA; Inhale 2 puffs into the lungs every 6 (six) hours as needed for wheezing or shortness of breath (for asthma).  Dispense: 18 g; Refill: 1 -     Budesonide -Formoterol  Fumarate; Inhale 2 puffs into the lungs daily. For breathing/respiratory issues  Dispense: 1 each; Refill: 5  Immunization due -     Flu vaccine HIGH DOSE PF(Fluzone Trivalent)  Hypertension, unspecified type  Seasonal allergies -     Fluticasone  Propionate; Place 2 sprays into both nostrils daily.  Dispense: 16 g; Refill: 11 -     Loratadine ; Take 1 tablet (10 mg total) by mouth daily.  Dispense: 90 tablet; Refill: 3  Anemia, unspecified type -     Iron, TIBC and Ferritin Panel -     Vitamin B12  Numbness and tingling in right hand -     TSH  Other orders -     Gabapentin ; Take 1 capsule (100 mg total) by mouth at bedtime. And increase up to 3 at night time  Dispense: 90 capsule; Refill: 1   Assessment and Plan    Asthma and seasonal allergic rhinitis Asthma exacerbation likely due to seasonal allergies, evidenced by increased albuterol  use and recent allergen exposure. Allergic rhinitis not well controlled due to non-use of Flonase  and lack of trial with other antihistamines. - Prescribe Claritin , Zyrtec, or Allegra. - Encourage use of Flonase  as prescribed.  Anemia Anemia indicated by recent lab results, with no reported symptoms. - Order blood test to check iron levels.  Peripheral neuropathy of the right hand Intermittent numbness in fingers, likely due to peripheral neuropathy or carpal tunnel syndrome. Symptoms present for about a year, affecting different fingers at different times, with no weakness or impact on daily activities. - Prescribe gabapentin  100mg  at night time.   Essential hypertension Advised against using Aleve D due to potential for increased blood pressure.  General  Health Maintenance Received flu shot. Discussed healthy eating habits and recent weight loss due to dietary changes. - Administer flu shot. - Encourage continued healthy eating habits.          Return in about 4 months (around 03/21/2024), or if symptoms worsen or fail to improve.   Subjective:    Patient ID: Mark Harris, male    DOB: [birth year]  Age: 83 y.o. MRN: [id number]  Chief Complaint  Patient presents with   Medical Management of Chronic Issues    HPI Discussed the use of AI scribe software for clinical note transcription with the patient, who gave verbal consent to proceed.  History of Present Illness        Mark Harris is an 83 year old male with asthma who presents for follow up on chronic medical issues, asthma, allergies and with recent increased use of albuterol  inhaler.  He has been using his albuterol  inhaler approximately five times a day, particularly during outdoor activities like yard work. Last week, his asthma was less stable, with episodes of wheezing lasting a couple of days. He uses Symbicort  daily. He has been doing a lot of yard work and putting pine needles on his property.   He has not been using Flonase  despite having a prescription and has never taken Claritin , Zyrtec, or Allegra for allergies. Occasionally, he uses Aleve Sinus D, which he notes his wife also uses.  He has experienced numbness in his right hand/fingers for  about a year, affecting different fingers at different times. There is no associated weakness or impact on mobility, but he notes difficulty with tasks like zipping pants. He is left-hand dominant and the numbness does not wake him at night.  He is aware of being anemic, as informed by a blood donation center due to low iron levels, preventing him from donating blood. This was first noted three to four years ago. He denies feeling tired or experiencing other symptoms related to anemia. iFOB was negative   He has made  dietary changes, such as switching from coffee with cream and sugar to black coffee, resulting in weight loss. He continues to engage in physical activities like yard work, which Retail buyer down pine needles. Physical Exam CHEST: Lungs clear to auscultation bilaterally. Results LABS Fecal occult blood test negative  Assessment & Plan Asthma and seasonal allergic rhinitis Asthma exacerbation likely due to seasonal allergies, evidenced by increased albuterol  use and recent allergen exposure. Allergic rhinitis not well controlled due to non-use of Flonase  and lack of trial with other antihistamines. - Prescribe Claritin , Zyrtec, or Allegra. - Encourage use of Flonase  as prescribed.  Anemia Anemia indicated by recent lab results, with no reported symptoms. - Order blood test to check iron levels.  Peripheral neuropathy of the right hand Intermittent numbness in fingers, likely due to peripheral neuropathy or carpal tunnel syndrome. Symptoms present for about a year, affecting different fingers at different times, with no weakness or impact on daily activities. - Prescribe gabapentin  100mg  at night time.   Essential hypertension Advised against using Aleve D due to potential for increased blood pressure.  General Health Maintenance Received flu shot. Discussed healthy eating habits and recent weight loss due to dietary changes. - Administer flu shot.    The ASCVD Risk score (Arnett DK, et al., 2019) failed to calculate for the following reasons:   The 2019 ASCVD risk score is only valid for ages 47 to 50  Past Medical History:  Diagnosis Date   Asthma    Dyspnea    Glaucoma    right eye   History of kidney stones    Hypertension    Past Surgical History:  Procedure Laterality Date   CIRCUMCISION N/A 09/01/2016   Procedure: CIRCUMCISION ADULT;  Surgeon: Sherrilee Belvie CROME, MD;  Location: [facility];  Service: Urology;  Laterality: N/A;  2 HRS TOTAL  - CORDINATED CASE W/ DR  MAVIS WHO WILL GO 2ND MCKENZIE NEEDS 60 MIN FOR CIRC [phone] MEDICARE-[id number] A BCBS-[id number]   COLECTOMY     COLONOSCOPY N/A 10/02/2016   Procedure: COLONOSCOPY;  Surgeon: Golda Claudis PENNER, MD;  [facility];  Service: Endoscopy;  Laterality: N/A;  1:00   HERNIA REPAIR     umbilical   INGUINAL HERNIA REPAIR Right 06/25/2012   Procedure: HERNIA REPAIR INGUINAL ADULT;  Surgeon: Thresa JAYSON Pulling, MD;  Location: [facility];  Service: General;  Laterality: Right;   INGUINAL HERNIA REPAIR N/A 09/01/2016   Procedure: RECURRENT HERNIA REPAIR INGUINAL ADULT WITH MESH;  Surgeon: MAVIS Anes, MD;  Location: [facility];  Service: General;  Laterality: N/A;   INSERTION OF MESH Right 06/25/2012   Procedure: INSERTION OF MESH;  Surgeon: Thresa JAYSON Pulling, MD;  Location: [facility];  Service: General;  Laterality: Right;   PARTIAL COLECTOMY N/A 11/03/2016   Procedure: PARTIAL COLECTOMY;  Surgeon: MAVIS Anes, MD;  Location: [facility];  Service: General;  Laterality: N/A;   POLYPECTOMY  10/02/2016   Procedure: POLYPECTOMY;  Surgeon: Golda Claudis PENNER, MD;  [facility];  Service: Endoscopy;;  colon   Social History   Tobacco Use   Smoking status: Former    Types: Cigars   Smokeless tobacco: Never   Tobacco comments:    chewed cigars  Vaping Use   Vaping status: Never Used  Substance Use Topics   Alcohol use: Yes    Comment: one glass of wine a day   Drug use: No   Family History  Problem Relation Age of Onset   Colon cancer Neg Hx    No Known Allergies  ROS    Objective:    BP 132/78   Pulse 67   Temp 98.8 F (37.1 C)   Ht 5' 6 (1.676 m)   Wt 181 lb 4 oz (82.2 kg)   SpO2 95%   BMI 29.25 kg/m  BP Readings from Last 3 Encounters:  11/20/23 132/78  10/21/23 (!) 152/84  05/21/23 (!) 199/74   Wt Readings from Last 3 Encounters:  11/20/23 181 lb 4 oz (82.2 kg)  10/21/23 190 lb 9.6 oz (86.5 kg)  11/13/16 185 lb (83.9 kg)    Physical Exam Vitals and  nursing note reviewed.  Constitutional:      Appearance: Normal appearance.  HENT:     Head: Normocephalic.     Right Ear: Tympanic membrane, ear canal and external ear normal.     Left Ear: Tympanic membrane, ear canal and external ear normal.  Eyes:     Extraocular Movements: Extraocular movements intact.     Conjunctiva/sclera: Conjunctivae normal.     Pupils: Pupils are equal, round, and reactive to light.  Cardiovascular:     Rate and Rhythm: Normal rate and regular rhythm.     Heart sounds: Normal heart sounds.  Pulmonary:     Effort: Pulmonary effort is normal.     Breath sounds: Normal breath sounds.  Musculoskeletal:     Right lower leg: No edema.     Left lower leg: No edema.  Neurological:     General: No focal deficit present.     Mental Status: He is alert and oriented to person, place, and time.  Psychiatric:        Mood and Affect: Mood normal.        Behavior: Behavior normal.        Thought Content: Thought content normal.        Judgment: Judgment normal.      No results found for any visits on 11/20/23.

## 2023-11-21 LAB — IRON,TIBC AND FERRITIN PANEL
%SAT: 27 % (ref 20–48)
Ferritin: 90 ng/mL (ref 24–380)
Iron: 84 ug/dL (ref 50–180)
TIBC: 315 ug/dL (ref 250–425)

## 2023-11-21 LAB — VITAMIN B12: Vitamin B-12: 161 pg/mL — ABNORMAL LOW (ref 200–1100)

## 2023-11-21 LAB — TSH: TSH: 0.9 m[IU]/L (ref 0.40–4.50)

## 2023-11-23 ENCOUNTER — Ambulatory Visit: Payer: Self-pay | Admitting: Family Medicine

## 2023-12-10 DIAGNOSIS — Z23 Encounter for immunization: Secondary | ICD-10-CM | POA: Diagnosis not present

## 2024-01-07 DIAGNOSIS — H2512 Age-related nuclear cataract, left eye: Secondary | ICD-10-CM | POA: Diagnosis not present

## 2024-01-07 DIAGNOSIS — H524 Presbyopia: Secondary | ICD-10-CM | POA: Diagnosis not present

## 2024-01-07 DIAGNOSIS — Z961 Presence of intraocular lens: Secondary | ICD-10-CM | POA: Diagnosis not present

## 2024-01-07 DIAGNOSIS — H25012 Cortical age-related cataract, left eye: Secondary | ICD-10-CM | POA: Diagnosis not present

## 2024-01-07 DIAGNOSIS — H401113 Primary open-angle glaucoma, right eye, severe stage: Secondary | ICD-10-CM | POA: Diagnosis not present

## 2024-01-07 DIAGNOSIS — H52203 Unspecified astigmatism, bilateral: Secondary | ICD-10-CM | POA: Diagnosis not present

## 2024-01-23 ENCOUNTER — Other Ambulatory Visit: Payer: Self-pay | Admitting: Family Medicine

## 2024-01-23 DIAGNOSIS — J4540 Moderate persistent asthma, uncomplicated: Secondary | ICD-10-CM

## 2024-01-25 NOTE — Telephone Encounter (Signed)
 Requested Prescriptions  Pending Prescriptions Disp Refills   albuterol  (VENTOLIN  HFA) 108 (90 Base) MCG/ACT inhaler [Pharmacy Med Name: Albuterol  Sulfate HFA 108 (90 Base) MCG/ACT Inhalation Aerosol Solution] 9 g 0    Sig: INHALE 2 PUFFS BY MOUTH EVERY 6 HOURS AS NEEDED FOR WHEEZING FOR SHORTNESS OF BREATH FOR ASTHMA     Pulmonology:  [facility]on[facility] 2 Passed - 01/25/2024  4:06 PM      Passed - Last BP in normal range    BP Readings from Last 1 Encounters:  11/20/23 132/78         Passed - Last Heart Rate in normal range    Pulse Readings from Last 1 Encounters:  11/20/23 67         Passed - Valid encounter within last 12 months    Recent Outpatient Visits           2 months ago Moderate persistent asthma without complication   [facility] Aletha Bene, MD   3 months ago Encounter to establish care   [facility] Aletha Bene, MD

## 2024-02-24 ENCOUNTER — Ambulatory Visit: Admitting: Family Medicine

## 2024-02-24 ENCOUNTER — Encounter: Payer: Self-pay | Admitting: Family Medicine

## 2024-02-24 DIAGNOSIS — E1165 Type 2 diabetes mellitus with hyperglycemia: Secondary | ICD-10-CM | POA: Diagnosis not present

## 2024-02-24 DIAGNOSIS — I10 Essential (primary) hypertension: Secondary | ICD-10-CM | POA: Diagnosis not present

## 2024-02-24 DIAGNOSIS — Z23 Encounter for immunization: Secondary | ICD-10-CM | POA: Diagnosis not present

## 2024-02-24 DIAGNOSIS — E538 Deficiency of other specified B group vitamins: Secondary | ICD-10-CM | POA: Diagnosis not present

## 2024-02-24 NOTE — Progress Notes (Signed)
 Patient Office Visit  Assessment & Plan:  Hypertension, unspecified type -     CBC with Differential/Platelet -     Comprehensive metabolic panel with GFR  Immunization due -     Varicella-zoster vaccine IM  Vitamin B12 deficiency -     Iron, TIBC and Ferritin Panel -     Vitamin B12  Type 2 diabetes mellitus with hyperglycemia, without long-term current use of insulin (HCC) -     Hemoglobin A1c -     Lipid panel -     Microalbumin / creatinine urine ratio   Assessment and Plan    Vitamin B12 deficiency B12 levels were previously low, improved with supplementation. - Continue B12 supplementation.  Type 2 diabetes mellitus Dietary changes made. Discussed potential cholesterol medication due to increased cardiovascular risk. - Ordered blood work including A1c and cholesterol levels. - Consider cholesterol medication based on results.  General Health Maintenance Up to date with flu and shingles vaccines. Maintains healthy lifestyle. - Continue annual flu vaccination. - Continue shingles vaccine series.     Hypertension -stable on current medication Recommend healthy diet i.e mediterranean/DASH diet, consistent exercise - 30 minutes 5 day per week, and gradual weight loss. Return in about 6 months (around 08/24/2024), or if symptoms worsen or fail to improve.   Subjective:    Patient ID: Mark Harris, male    DOB: 05/01/1940  Age: 83 y.o. MRN: [id number]  Chief Complaint  Patient presents with   Medical Management of Chronic Issues    HPI Discussed the use of AI scribe software for clinical note transcription with the patient, who gave verbal consent to proceed.  History of Present Illness        History of Present Illness Mark Harris is an 83 year old male with new onset type 2 diabetes who presents for a follow-up visit.  He has been taking vitamin B12 supplements after being found to have low levels and reports feeling better since starting  them. He was also found to have iron levels on the low end of normal. No blood in stool has been noted.  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 His last colonoscopy was performed at age 29.  He has type 2 diabetes and has made dietary changes, including reducing sugar and salt intake. He remains physically active. He has not been on cholesterol medication in the past, but he has had borderline sugars previously diagnosed as prediabetes. HTN-using antihypertensive medication without difficulty.  Denies associated signs and symptoms including chest pain, shortness of breath, cough headache, peripheral swelling cramps spasms and palpitations.   Blood pressures at home are less than 140/90.    He has glaucoma and sees an eye doctor three times a month.  He has received his flu shot for the season.  Physical Exam CHEST: Lungs clear to auscultation bilaterally.  Results LABS Vitamin B12: significantly low Fecal Occult Blood Test: negative  Assessment and Plan Vitamin B12 deficiency B12 levels were previously low, improved with supplementation. - Continue B12 supplementation.  Type 2 diabetes mellitus Dietary changes made. Discussed potential cholesterol medication due to increased cardiovascular risk. - Ordered blood work including A1c and cholesterol levels. - Consider cholesterol medication based on results.  General Health Maintenance Up to date with flu and shingles vaccines. Maintains healthy lifestyle. - Continue annual flu vaccination. - Continue shingles vaccine series.  The ASCVD Risk score (Arnett DK, et al., 2019) failed to calculate for the following reasons:   The 2019 ASCVD risk score is only valid for ages 29 to 40  Past Medical History:  Diagnosis Date   Asthma    Dyspnea    Glaucoma    right eye   History of kidney stones    Hypertension    Past Surgical History:   Procedure Laterality Date   APPENDECTOMY     CIRCUMCISION N/A 09/01/2016   Procedure: CIRCUMCISION ADULT;  Surgeon: Sherrilee Belvie CROME, MD;  Location: [facility];  Service: Urology;  Laterality: N/A;  2 HRS TOTAL  - CORDINATED CASE W/ DR MAVIS WHO WILL GO 2ND [facility]IE NEEDS 60 MIN FOR CIRC [phone] MEDICARE-[id number] A [employer]-[id number]   COLECTOMY     COLONOSCOPY N/A 10/02/2016   Procedure: COLONOSCOPY;  Surgeon: Golda Claudis PENNER, MD;  Location: [facility];  Service: Endoscopy;  Laterality: N/A;  1:00   HERNIA REPAIR     umbilical   INGUINAL HERNIA REPAIR Right 06/25/2012   Procedure: HERNIA REPAIR INGUINAL ADULT;  Surgeon: Thresa JAYSON Pulling, MD;  Location: [facility];  Service: General;  Laterality: Right;   INGUINAL HERNIA REPAIR N/A 09/01/2016   Procedure: RECURRENT HERNIA REPAIR INGUINAL ADULT WITH MESH;  Surgeon: Mavis Anes, MD;  Location: [facility];  Service: General;  Laterality: N/A;   INSERTION OF MESH Right 06/25/2012   Procedure: INSERTION OF MESH;  Surgeon: Thresa JAYSON Pulling, MD;  Location: [facility];  Service: General;  Laterality: Right;   PARTIAL COLECTOMY N/A 11/03/2016   Procedure: PARTIAL COLECTOMY;  Surgeon: Mavis Anes, MD;  Location: [facility];  Service: General;  Laterality: N/A;   POLYPECTOMY  10/02/2016   Procedure: POLYPECTOMY;  Surgeon: Golda Claudis PENNER, MD;  Location: [facility];  Service: Endoscopy;;  colon   Social History   Tobacco Use   Smoking status: Former    Types: Cigars   Smokeless tobacco: Never   Tobacco comments:    chewed cigars  Vaping Use   Vaping status: Never Used  Substance Use Topics   Alcohol use: Yes    Comment: one glass of wine a day   Drug use: No   Family History  Problem Relation Age of Onset   Colon cancer Neg Hx    No Known Allergies  ROS    Objective:    BP 136/84   Pulse 60   Temp 98.5 F (36.9 C)   Ht 5' 6 (1.676 m)   Wt 189 lb 8 oz (86 kg)   SpO2 97%   BMI 30.59 kg/m  BP Readings from Last 3  Encounters:  02/24/24 136/84  11/20/23 132/78  10/21/23 (!) 152/84   Wt Readings from Last 3 Encounters:  02/24/24 189 lb 8 oz (86 kg)  11/20/23 181 lb 4 oz (82.2 kg)  10/21/23 190 lb 9.6 oz (86.5 kg)    Physical Exam Vitals and nursing note reviewed.  Constitutional:      General: He is not in acute distress.    Appearance: Normal appearance.  HENT:     Head: Normocephalic.     Right Ear: Tympanic membrane, ear canal and external ear normal.     Left Ear: Tympanic membrane, ear canal and external ear normal.  Eyes:     Extraocular Movements: Extraocular movements intact.     Pupils: Pupils are equal, round, and reactive to light.  Cardiovascular:     Rate and Rhythm: Normal rate and  regular rhythm.     Heart sounds: Normal heart sounds.  Pulmonary:     Effort: Pulmonary effort is normal.     Breath sounds: Normal breath sounds.  Musculoskeletal:     Right lower leg: No edema.     Left lower leg: No edema.  Neurological:     General: No focal deficit present.     Mental Status: He is alert and oriented to person, place, and time.  Psychiatric:        Mood and Affect: Mood normal.        Behavior: Behavior normal.        Thought Content: Thought content normal.        Judgment: Judgment normal.      No results found for any visits on 02/24/24.

## 2024-02-25 LAB — VITAMIN B12: Vitamin B-12: 477 pg/mL (ref 200–1100)

## 2024-02-25 LAB — LIPID PANEL
Cholesterol: 205 mg/dL — ABNORMAL HIGH (ref <200)
HDL: 60 mg/dL (ref >=40)
LDL Cholesterol (Calc): 119 mg/dL — ABNORMAL HIGH
Non-HDL Cholesterol (Calc): 145 mg/dL — ABNORMAL HIGH (ref <130)
Total CHOL/HDL Ratio: 3.4 (calc) (ref <5.0)
Triglycerides: 148 mg/dL (ref <150)

## 2024-02-25 LAB — HEMOGLOBIN A1C
Hgb A1c MFr Bld: 6.4 % — ABNORMAL HIGH (ref <5.7)
Mean Plasma Glucose: 137 mg/dL
eAG (mmol/L): 7.6 mmol/L

## 2024-02-25 LAB — COMPREHENSIVE METABOLIC PANEL WITH GFR
AG Ratio: 1.6 (calc) (ref 1.0–2.5)
ALT: 16 U/L (ref 9–46)
AST: 17 U/L (ref 10–35)
Albumin: 4.1 g/dL (ref 3.6–5.1)
Alkaline phosphatase (APISO): 59 U/L (ref 35–144)
BUN: 13 mg/dL (ref 7–25)
CO2: 27 mmol/L (ref 20–32)
Calcium: 9.4 mg/dL (ref 8.6–10.3)
Chloride: 104 mmol/L (ref 98–110)
Creat: 0.76 mg/dL (ref 0.70–1.22)
Globulin: 2.6 g/dL (ref 1.9–3.7)
Glucose, Bld: 135 mg/dL — ABNORMAL HIGH (ref 65–99)
Potassium: 4.4 mmol/L (ref 3.5–5.3)
Sodium: 140 mmol/L (ref 135–146)
Total Bilirubin: 0.4 mg/dL (ref 0.2–1.2)
Total Protein: 6.7 g/dL (ref 6.1–8.1)
eGFR: 89 mL/min/1.73m2 (ref >=60)

## 2024-02-25 LAB — IRON,TIBC AND FERRITIN PANEL
%SAT: 25 % (ref 20–48)
Ferritin: 74 ng/mL (ref 24–380)
Iron: 81 ug/dL (ref 50–180)
TIBC: 327 ug/dL (ref 250–425)

## 2024-02-25 LAB — CBC WITH DIFFERENTIAL/PLATELET
Absolute Lymphocytes: 2191 {cells}/uL (ref 850–3900)
Absolute Monocytes: 664 {cells}/uL (ref 200–950)
Basophils Absolute: 17 {cells}/uL (ref 0–200)
Basophils Relative: 0.2 %
Eosinophils Absolute: 232 {cells}/uL (ref 15–500)
Eosinophils Relative: 2.8 %
HCT: 40.6 % (ref 39.4–51.1)
Hemoglobin: 13.2 g/dL (ref 13.2–17.1)
MCH: 31.1 pg (ref 27.0–33.0)
MCHC: 32.5 g/dL (ref 31.6–35.4)
MCV: 95.5 fL (ref 81.4–101.7)
MPV: 11.3 fL (ref 7.5–12.5)
Monocytes Relative: 8 %
Neutro Abs: 5196 {cells}/uL (ref 1500–7800)
Neutrophils Relative %: 62.6 %
Platelets: 222 Thousand/uL (ref 140–400)
RBC: 4.25 Million/uL (ref 4.20–5.80)
RDW: 12.8 % (ref 11.0–15.0)
Total Lymphocyte: 26.4 %
WBC: 8.3 Thousand/uL (ref 3.8–10.8)

## 2024-02-25 LAB — MICROALBUMIN / CREATININE URINE RATIO
Creatinine, Urine: 61 mg/dL (ref 20–320)
Microalb Creat Ratio: 297 mg/g{creat} — ABNORMAL HIGH (ref <30)
Microalb, Ur: 18.1 mg/dL

## 2024-02-26 ENCOUNTER — Ambulatory Visit: Payer: Self-pay | Admitting: Family Medicine

## 2024-02-26 ENCOUNTER — Other Ambulatory Visit: Payer: Self-pay

## 2024-02-26 MED ORDER — ROSUVASTATIN CALCIUM 10 MG PO TABS: 10.0000 mg | ORAL_TABLET | Freq: Every day | ORAL | 3 refills | Status: AC

## 2024-03-24 ENCOUNTER — Other Ambulatory Visit: Payer: Self-pay | Admitting: Family Medicine

## 2024-03-24 DIAGNOSIS — J4540 Moderate persistent asthma, uncomplicated: Secondary | ICD-10-CM

## 2024-08-29 ENCOUNTER — Ambulatory Visit: Admitting: Family Medicine
